# Patient Record
Sex: Female | Born: 1980 | Race: Black or African American | Hispanic: No | Marital: Single | Smoking: Never smoker
Health system: Southern US, Community
[De-identification: ages and names within clinical notes are randomized; demographics above are authoritative.]

## PROBLEM LIST (undated history)

## (undated) ENCOUNTER — Inpatient Hospital Stay (HOSPITAL_COMMUNITY): Payer: Self-pay

## (undated) DIAGNOSIS — G9600 Cerebrospinal fluid leak, unspecified: Secondary | ICD-10-CM

## (undated) DIAGNOSIS — N200 Calculus of kidney: Secondary | ICD-10-CM

## (undated) DIAGNOSIS — G932 Benign intracranial hypertension: Secondary | ICD-10-CM

## (undated) DIAGNOSIS — Z9889 Other specified postprocedural states: Secondary | ICD-10-CM

## (undated) DIAGNOSIS — R002 Palpitations: Secondary | ICD-10-CM

## (undated) HISTORY — DX: Palpitations: R00.2

## (undated) HISTORY — PX: LITHOTRIPSY: SUR834

## (undated) HISTORY — PX: DILATION AND CURETTAGE OF UTERUS: SHX78

---

## 2001-12-15 ENCOUNTER — Emergency Department (HOSPITAL_COMMUNITY): Admission: EM | Admit: 2001-12-15 | Discharge: 2001-12-15 | Payer: Self-pay | Admitting: Emergency Medicine

## 2002-04-19 ENCOUNTER — Emergency Department (HOSPITAL_COMMUNITY): Admission: EM | Admit: 2002-04-19 | Discharge: 2002-04-19 | Payer: Self-pay | Admitting: Emergency Medicine

## 2002-07-20 ENCOUNTER — Emergency Department (HOSPITAL_COMMUNITY): Admission: EM | Admit: 2002-07-20 | Discharge: 2002-07-20 | Payer: Self-pay | Admitting: Emergency Medicine

## 2003-12-14 ENCOUNTER — Inpatient Hospital Stay (HOSPITAL_COMMUNITY): Admission: AD | Admit: 2003-12-14 | Discharge: 2003-12-14 | Payer: Self-pay | Admitting: Family Medicine

## 2006-09-11 ENCOUNTER — Emergency Department (HOSPITAL_COMMUNITY): Admission: EM | Admit: 2006-09-11 | Discharge: 2006-09-11 | Payer: Self-pay | Admitting: Emergency Medicine

## 2006-10-21 ENCOUNTER — Emergency Department (HOSPITAL_COMMUNITY): Admission: EM | Admit: 2006-10-21 | Discharge: 2006-10-21 | Payer: Self-pay | Admitting: Family Medicine

## 2007-03-06 ENCOUNTER — Encounter: Admission: RE | Admit: 2007-03-06 | Discharge: 2007-03-06 | Payer: Self-pay | Admitting: Otolaryngology

## 2007-06-11 ENCOUNTER — Inpatient Hospital Stay (HOSPITAL_COMMUNITY): Admission: AD | Admit: 2007-06-11 | Discharge: 2007-06-20 | Payer: Self-pay | Admitting: Obstetrics & Gynecology

## 2007-06-18 ENCOUNTER — Encounter: Payer: Self-pay | Admitting: Obstetrics & Gynecology

## 2007-06-19 ENCOUNTER — Encounter (INDEPENDENT_AMBULATORY_CARE_PROVIDER_SITE_OTHER): Payer: Self-pay | Admitting: *Deleted

## 2007-08-03 ENCOUNTER — Ambulatory Visit (HOSPITAL_COMMUNITY): Admission: RE | Admit: 2007-08-03 | Discharge: 2007-08-03 | Payer: Self-pay | Admitting: *Deleted

## 2007-10-11 HISTORY — PX: ABDOMINAL CERCLAGE: SHX5384

## 2007-11-09 ENCOUNTER — Inpatient Hospital Stay (HOSPITAL_COMMUNITY): Admission: AD | Admit: 2007-11-09 | Discharge: 2007-11-09 | Payer: Self-pay | Admitting: Obstetrics & Gynecology

## 2007-11-26 ENCOUNTER — Ambulatory Visit (HOSPITAL_COMMUNITY): Admission: RE | Admit: 2007-11-26 | Discharge: 2007-11-26 | Payer: Self-pay | Admitting: *Deleted

## 2007-12-16 ENCOUNTER — Inpatient Hospital Stay (HOSPITAL_COMMUNITY): Admission: AD | Admit: 2007-12-16 | Discharge: 2007-12-16 | Payer: Self-pay | Admitting: Obstetrics & Gynecology

## 2007-12-16 ENCOUNTER — Inpatient Hospital Stay (HOSPITAL_COMMUNITY): Admission: AD | Admit: 2007-12-16 | Discharge: 2007-12-16 | Payer: Self-pay | Admitting: *Deleted

## 2007-12-18 ENCOUNTER — Encounter (INDEPENDENT_AMBULATORY_CARE_PROVIDER_SITE_OTHER): Payer: Self-pay | Admitting: Obstetrics and Gynecology

## 2007-12-18 ENCOUNTER — Inpatient Hospital Stay (HOSPITAL_COMMUNITY): Admission: AD | Admit: 2007-12-18 | Discharge: 2007-12-18 | Payer: Self-pay | Admitting: Obstetrics and Gynecology

## 2007-12-26 ENCOUNTER — Encounter (INDEPENDENT_AMBULATORY_CARE_PROVIDER_SITE_OTHER): Payer: Self-pay | Admitting: Obstetrics

## 2007-12-26 ENCOUNTER — Ambulatory Visit (HOSPITAL_COMMUNITY): Admission: RE | Admit: 2007-12-26 | Discharge: 2007-12-26 | Payer: Self-pay | Admitting: Obstetrics

## 2008-01-07 ENCOUNTER — Ambulatory Visit (HOSPITAL_COMMUNITY): Admission: RE | Admit: 2008-01-07 | Discharge: 2008-01-07 | Payer: Self-pay | Admitting: *Deleted

## 2008-09-26 IMAGING — US US OB FOLLOW-UP
1 series · 14 of 28 positions shown · non-contrast
Comparison: none

OBSTETRICAL ULTRASOUND:

 This ultrasound exam was performed in the [HOSPITAL] Ultrasound Department.  The OB US report was generated in the AS system, and faxed to the ordering physician.  This report is also available in [REDACTED] PACS.

[Series 1: us ob follow-up · 0.28mm/px · 29 acquisitions, 14 frames shown]
[im 2/29]
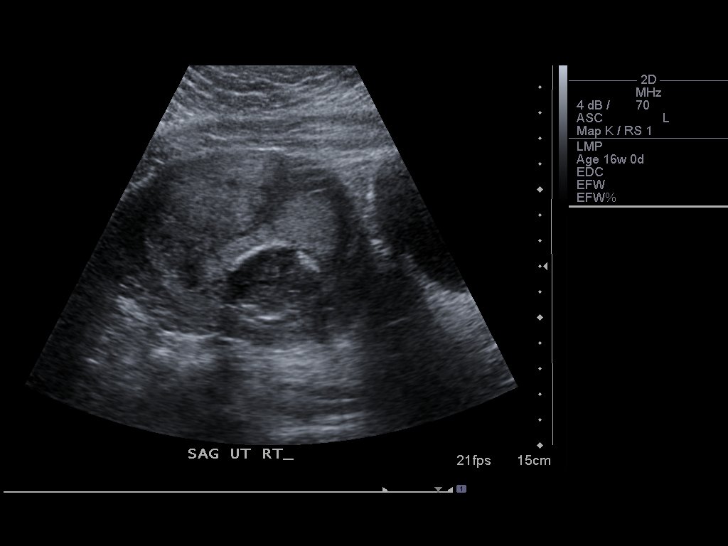
[im 4/29]
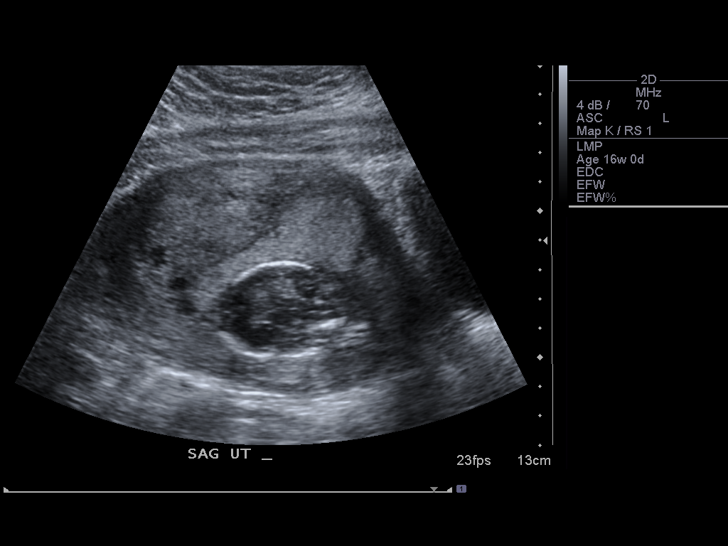
[im 6/29]
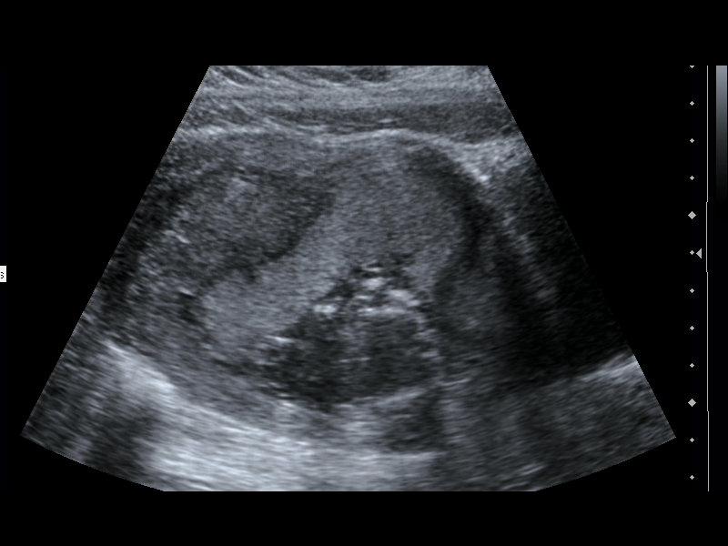
[im 8/29]
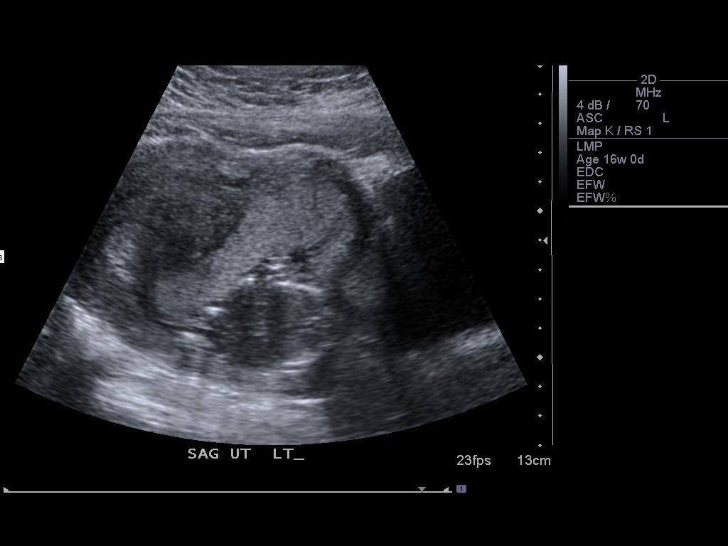
[im 10/29]
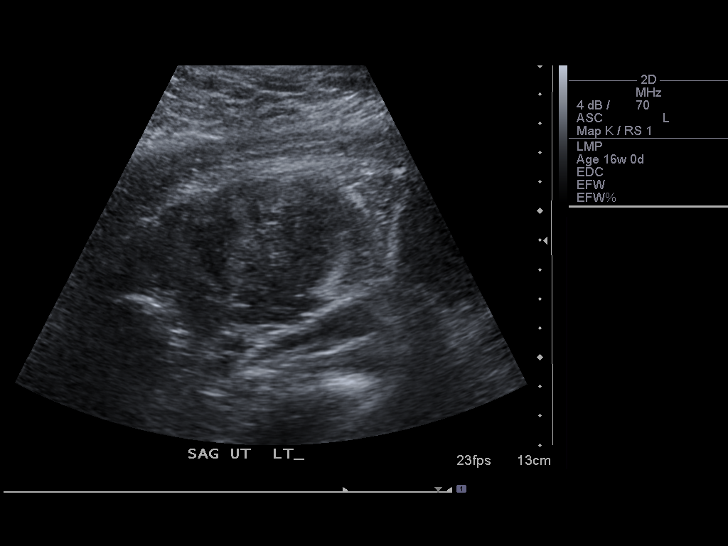
[im 12/29]
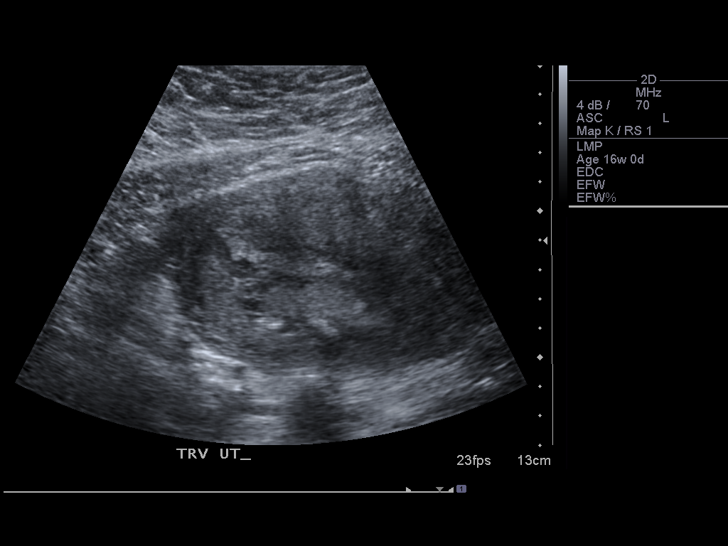
[im 14/29]
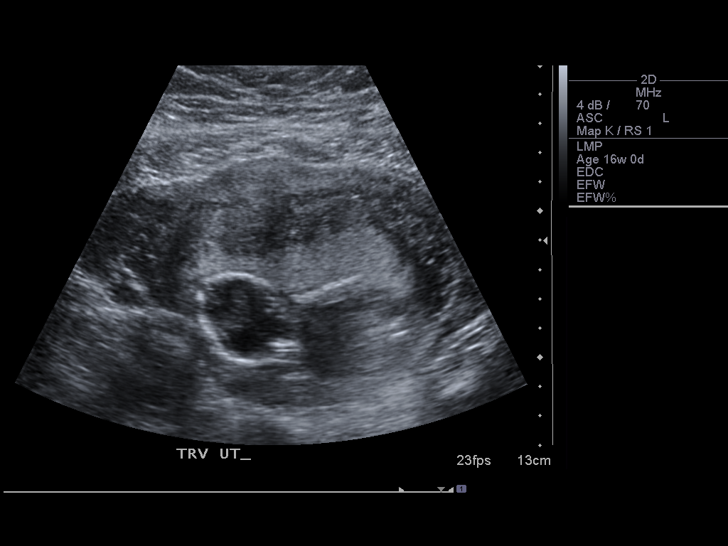
[im 16/29]
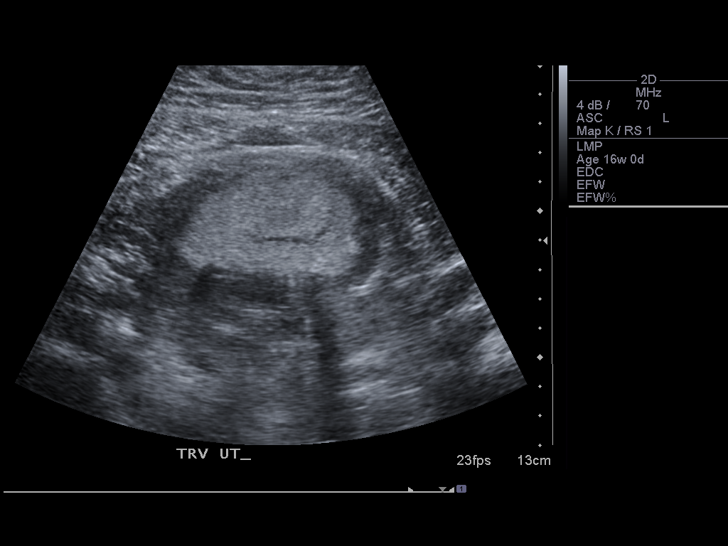
[im 18/29]
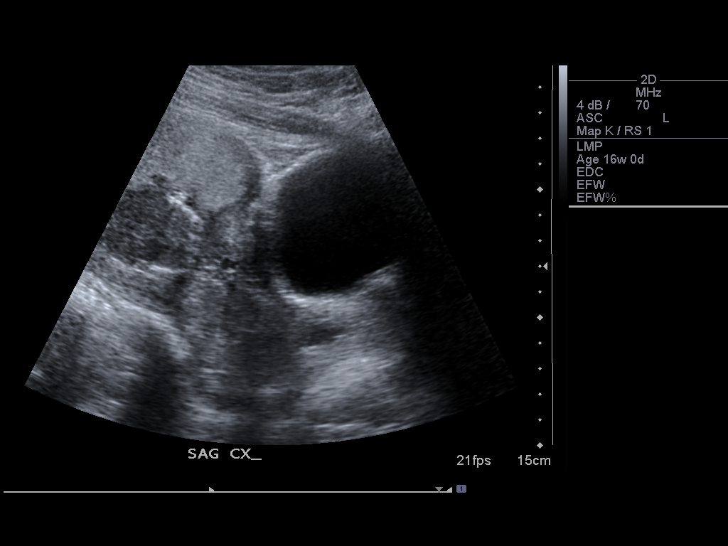
[im 20/29]
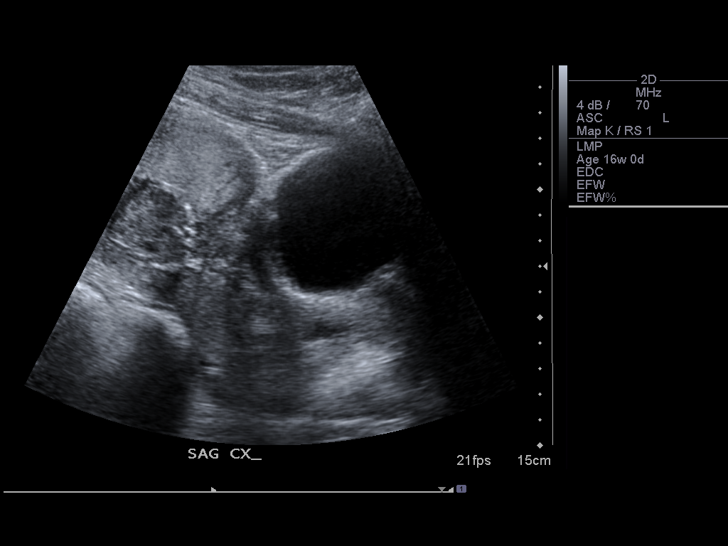
[im 22/29]
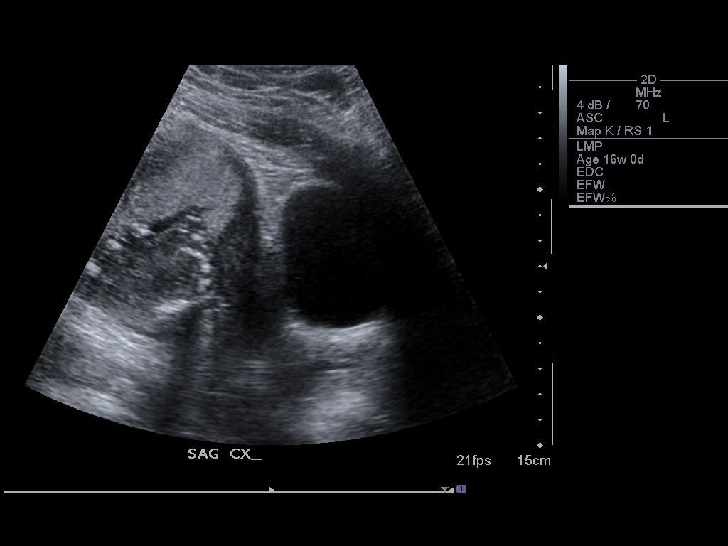
[im 24/29]
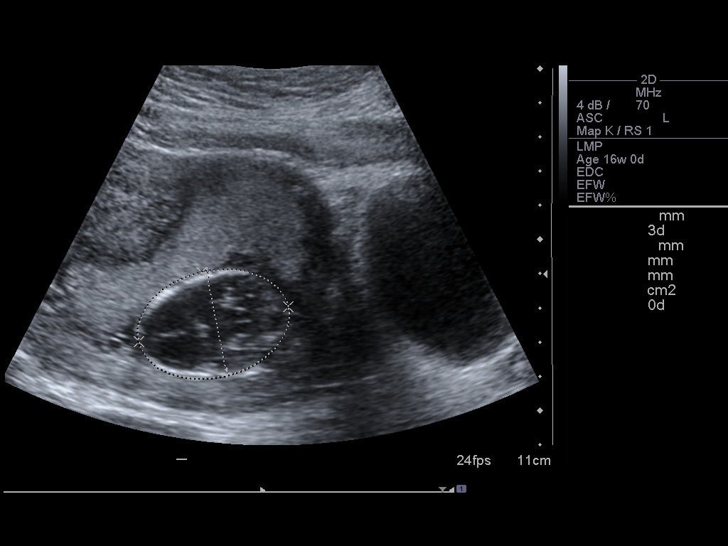
[im 26/29]
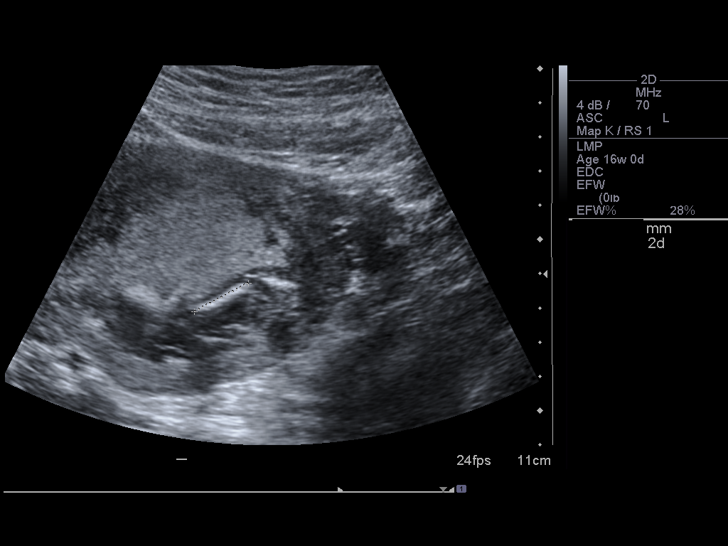
[im 29/29]
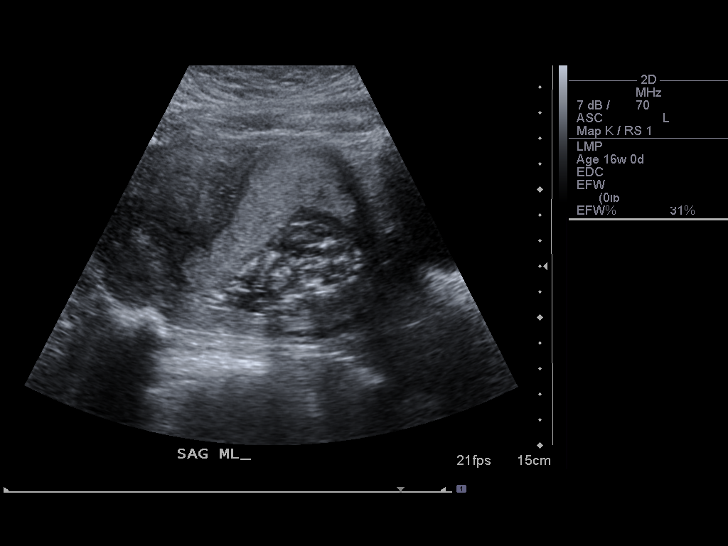

[14 of 28 positions shown; findings below may reference images not displayed]

IMPRESSION: See AS Obstetric US report.

## 2008-12-04 ENCOUNTER — Inpatient Hospital Stay (HOSPITAL_COMMUNITY): Admission: AD | Admit: 2008-12-04 | Discharge: 2008-12-04 | Payer: Self-pay | Admitting: Obstetrics

## 2009-03-03 ENCOUNTER — Inpatient Hospital Stay (HOSPITAL_COMMUNITY): Admission: AD | Admit: 2009-03-03 | Discharge: 2009-03-03 | Payer: Self-pay | Admitting: Obstetrics and Gynecology

## 2009-03-04 ENCOUNTER — Inpatient Hospital Stay (HOSPITAL_COMMUNITY): Admission: AD | Admit: 2009-03-04 | Discharge: 2009-03-04 | Payer: Self-pay | Admitting: Obstetrics and Gynecology

## 2009-03-07 ENCOUNTER — Inpatient Hospital Stay (HOSPITAL_COMMUNITY): Admission: RE | Admit: 2009-03-07 | Discharge: 2009-03-10 | Payer: Self-pay | Admitting: Obstetrics and Gynecology

## 2009-05-09 ENCOUNTER — Emergency Department (HOSPITAL_COMMUNITY): Admission: EM | Admit: 2009-05-09 | Discharge: 2009-05-09 | Payer: Self-pay | Admitting: Emergency Medicine

## 2010-08-22 ENCOUNTER — Emergency Department (HOSPITAL_BASED_OUTPATIENT_CLINIC_OR_DEPARTMENT_OTHER): Admission: EM | Admit: 2010-08-22 | Discharge: 2010-08-22 | Payer: Self-pay | Admitting: Emergency Medicine

## 2010-11-01 ENCOUNTER — Encounter: Payer: Self-pay | Admitting: Obstetrics & Gynecology

## 2010-12-12 ENCOUNTER — Inpatient Hospital Stay (HOSPITAL_COMMUNITY)
Admission: AD | Admit: 2010-12-12 | Discharge: 2010-12-12 | Disposition: A | Discharge status: Home / Self Care | Payer: 59 | Source: Ambulatory Visit | Attending: Obstetrics & Gynecology | Admitting: Obstetrics & Gynecology

## 2010-12-12 DIAGNOSIS — N949 Unspecified condition associated with female genital organs and menstrual cycle: Secondary | ICD-10-CM | POA: Insufficient documentation

## 2010-12-12 DIAGNOSIS — N390 Urinary tract infection, site not specified: Secondary | ICD-10-CM | POA: Insufficient documentation

## 2010-12-12 LAB — WET PREP, GENITAL
Clue Cells Wet Prep HPF POC: NONE SEEN
Trich, Wet Prep: NONE SEEN
Yeast Wet Prep HPF POC: NONE SEEN

## 2010-12-12 LAB — URINALYSIS, ROUTINE W REFLEX MICROSCOPIC
Bilirubin Urine: NEGATIVE
Hgb urine dipstick: NEGATIVE
Ketones, ur: NEGATIVE mg/dL
Nitrite: NEGATIVE
Protein, ur: NEGATIVE mg/dL

## 2010-12-13 LAB — GC/CHLAMYDIA PROBE AMP, GENITAL: GC Probe Amp, Genital: NEGATIVE

## 2010-12-14 LAB — URINE CULTURE
Culture  Setup Time: 201203051417
Special Requests: NEGATIVE

## 2011-01-16 LAB — POCT PREGNANCY, URINE: Preg Test, Ur: NEGATIVE

## 2011-01-16 LAB — POCT URINALYSIS DIP (DEVICE)
Glucose, UA: NEGATIVE mg/dL
Ketones, ur: NEGATIVE mg/dL
Specific Gravity, Urine: 1.02 (ref 1.005–1.030)
pH: 6 (ref 5.0–8.0)

## 2011-01-18 LAB — CBC
HCT: 27.3 % — ABNORMAL LOW (ref 36.0–46.0)
HCT: 31.1 % — ABNORMAL LOW (ref 36.0–46.0)
Hemoglobin: 10.8 g/dL — ABNORMAL LOW (ref 12.0–15.0)
Hemoglobin: 9.4 g/dL — ABNORMAL LOW (ref 12.0–15.0)
MCHC: 34.6 g/dL (ref 30.0–36.0)
MCV: 92.6 fL (ref 78.0–100.0)
Platelets: 226 10*3/uL (ref 150–400)
RDW: 14.5 % (ref 11.5–15.5)

## 2011-01-18 LAB — RPR: RPR Ser Ql: NONREACTIVE

## 2011-01-25 LAB — URINE CULTURE

## 2011-01-25 LAB — URINALYSIS, ROUTINE W REFLEX MICROSCOPIC
Bilirubin Urine: NEGATIVE
Glucose, UA: NEGATIVE mg/dL
Ketones, ur: NEGATIVE mg/dL
Nitrite: NEGATIVE
Protein, ur: NEGATIVE mg/dL

## 2011-02-22 NOTE — Discharge Summary (Signed)
Krystal [name], Krystal [name]                ACCOUNT NO.:  [id number]   MEDICAL RECORD NO.:  [id number]          PATIENT TYPE:  INP   LOCATION:  9118                          [facility]ACILITY:  [facility]   PHYSICIAN:  Lenoard Aden, M.D.DATE OF BIRTH:  [birth year]   DATE OF ADMISSION:  03/07/2009  DATE OF DISCHARGE:  03/10/2009                               DISCHARGE SUMMARY   The patient is a gravida 5, para 0-0-4-0 at 13 weeks' gestation.  The  patient has received prenatal care at [facility] since [redacted] weeks  gestation with Dr. Billy Coast, his primary.   Prenatal labs include the patient is O+, rubella positive, HIV negative,  RPR negative, hepatitis negative.  The patient has no known drug  allergies.   Current medications include a prenatal vitamin daily and hydrocodone 1  tablet p.o. every 4-6 hours as needed for pain.   SIGNIFICANT PREGNANCY HISTORY:  Termination AB x2 and 2 pregnancy losses  1 at 16 weeks and 1 at 21 weeks respectively for preterm premature  rupture of membranes and premature dilation with pregnancy loss.   Pregnancy course is complicated with incompetent cervix.  The patient  has a transabdominal cerclage in place.  The patient received weekly 17  B injection started 15 weeks until [redacted] weeks gestation.  The patient had  an amniocentesis for fetal lung maturity due to cervical change with  abdominal cerclage with positive fetal lung maturity at which time a C-  section was scheduled at [redacted] weeks gestation.  Preop CBC white blood cell  was 9.1, hemoglobin 10.8, hematocrit 31.1, platelet count of 226.  The  patient underwent a cesarean section on May 29 with Dr. Billy Coast, see  operative note for a viable female newborn.  Postoperative course was  uneventful with a postoperative CBC of white blood cell count 12,  hemoglobin 9.4, hematocrit 27.3, and platelet count of 201.  Vital signs  remained within normal limits and the patient remained afebrile during  postpartum course.  On day of  discharge, vital signs were 98.3 pulses  71, respirations 18, and a blood pressure of 104/68.  Wound was healing  appropriately and staples were removed on postoperative day #3 and Steri-  Strips applied.  Wound edges are well approximated.  No evidence of  seroma or infection at time of discharge.  The patient is in stable  condition, ambulatory, discharged home with her newborn.  Postoperative  instructions per [facility] discharge booklet.  Medications at time  of discharge include a prenatal vitamin 1 p.o. daily, ibuprofen 600 mg 1  p.o. every 6-8 hours as needed for discomfort, Percocet 1-2 tablets  every 6 hours as needed for pain.  The patient to follow up at [facility] in 6 weeks.      Marlinda Mike, C.N.M.      Lenoard Aden, M.D.  Electronically Signed    TB/MEDQ  D:  03/10/2009  T:  03/10/2009  Job:  [id number]

## 2011-02-22 NOTE — Discharge Summary (Signed)
NAMEKEIONNA, KINNAIRD                ACCOUNT NO.:  [id number]   MEDICAL RECORD NO.:  [id number]          PATIENT TYPE:  INP   LOCATION:  9314                          [facility]ACILITY[facility]  [facility]   PHYSICIAN:  Gerri Spore B. Earlene Plater, M.D.  DATE OF BIRTH:  June 05, 1981   DATE OF ADMISSION:  06/11/2007  DATE OF DISCHARGE:  06/20/2007                               DISCHARGE SUMMARY   ADMITTING DIAGNOSIS:  1. Nineteen-week intrauterine pregnancy.  2. Cervical incompetence.  3. Preterm premature rupture of membranes.   HISTORY OF PRESENT ILLNESS:  Patient is a 30 year old African-American  female, gravida 3, para 0, A-2, presented at 19-6/7 weeks with leakage  of fluid and cramping.  On physical exam, was found to have bulging  membranes at the cervical os, 3 cm dilated, and showed microscopic  evidence of ruptured membranes, although was not grossly ruptured at  that time.  Amniotic fluid index was greater than 10 cm with viable  fetus.   Patient was advised of the risks of infection and high likelihood of  poor fetal outcome.   HOSPITAL COURSE:  Patient was admitted, maternal fetal medicine  consultation obtained.  She was placed on ampicillin and erythromycin  per protocol.  Patient was again advised of the dismal prognosis;  however, wanted to continue the pregnancy if there was any hope at all.   She remained stable until the day prior to delivery, when she had gross  leakage of fluid.  Ultrasound confirmed no measurable fluid remaining.  She also had slightly increased leukocytosis.  Patient opted at that  point for termination of pregnancy.  This was performed without  incident, using Cytotec vaginally.  She had an uncomplicated vaginal  delivery and spontaneous expulsion of the placenta.   The day after delivery, patient remained afebrile, was grieving  appropriately and medically stable for discharge.  Patient was informed  that, with a subsequent pregnancy, cerclage will be recommended.   DISCHARGE INSTRUCTIONS:  Per booklet.   FOLLOWUP:  One month, [facility].  [facility] referral was made.   DISPOSITION AT DISCHARGE:  Satisfactory.      Gerri Spore B. Earlene Plater, M.D.  Electronically Signed     WBD/MEDQ  D:  06/20/2007  T:  06/20/2007  Job:  [id number]

## 2011-02-22 NOTE — H&P (Signed)
NAMEORINE, Krystal [name]                ACCOUNT NO.:  [id number]   MEDICAL RECORD NO.:  [id number]          PATIENT TYPE:  INP   LOCATION:  9118                          [facility]   PHYSICIAN:  Lenoard Aden, M.D.DATE OF BIRTH:  1981/01/26   DATE OF ADMISSION:  03/07/2009  DATE OF DISCHARGE:                              HISTORY & PHYSICAL   CHIEF COMPLAINT:  This is a 67 and 1/2-week intrauterine pregnancy with  a known abdominal cerclage, placed as an interval procedure for primary  C-section.  The patient has noted increased contractions, has noted on  NST in the past week with cervical change noted.  Decision was made to  proceed with primary C-section due to cervical changes with known  abdominal cerclage with concerns regarding cervical damage.  This case  was discussed with Maternal Fetal Medicine.  In addition, the patient  has had an amniocentesis performed on Mar 02, 2009, which revealed an  LS/PG ratio of 2.1, PG negative.  She received betamethasone on Mar 02, 2009, and Mar 03, 2009.   MEDICATIONS:  Prenatal vitamins.  She had 17 hydroxyprogesterone until  37 weeks.   She has a history of abnormal Pap smear, history of PPROM with D and C  for retained products, history of SAB, history of previously noted  uterine fibroids, history of 21 weeks' fetal demise.  She also has a  history of urolithiasis with 1 episode of flank pain during this  pregnancy.   On physical, she is otherwise a G5, P0 with a history of 2 losses as  noted and also TABs in 1998 and 2002.   PHYSICAL EXAMINATION:  GENERAL:  She is a well-developed, well-nourished  [place] female in no acute distress.  HEENT:  Normal.  LUNGS:  Clear.  HEART:  Regular rhythm.  ABDOMEN:  Soft, gravid, and nontender.  Estimated fetal weight 7-1/2  pounds.  Cervix is closed, 60% vertex, -1.  EXTREMITIES:  No cords.  NEUROLOGIC:  Nonfocal.  SKIN:  Intact.   IMPRESSION:  1. A 37 plus week intrauterine  pregnancy.  2. History of abdominal cerclage.  3. Group B streptococcus positive.  4. History of in vitro amniocentesis, status post betamethasone.  5. History of preterm birth and cervical insufficiency with abdominal      cerclage.  6. History of urolithiasis with transient episode this pregnancy, on      Vicodin briefly, no longer at this time.   PLAN:  Proceed with primary low-segment transverse cesarean section.  Risks of anesthesia, infection, bleeding, injury to abdominal organs,  need for repair discussed, delayed versus immediate complications to  include bowel and bladder injury noted.  The patient acknowledges and  wished to proceed.      Lenoard Aden, M.D.  Electronically Signed     RJT/MEDQ  D:  03/07/2009  T:  03/08/2009  Job:  [id number]

## 2011-02-22 NOTE — Op Note (Signed)
Krystal [name], Krystal [name]                ACCOUNT NO.:  [id number]   MEDICAL RECORD NO.:  [id number]          PATIENT TYPE:  AMB   LOCATION:  SDC                           [facility]   PHYSICIAN:  Lendon Colonel, MD   DATE OF BIRTH:  08/05/1981   DATE OF PROCEDURE:  12/26/2007  DATE OF DISCHARGE:                               OPERATIVE REPORT   PREOPERATIVE DIAGNOSIS:  Retained placenta.   POSTOPERATIVE DIAGNOSIS:  Retained placenta.   PROCEDURE:  Ultrasound-guided suction dilatation and curettage.   SURGEON:  Lendon Colonel, M.D.   ASSISTANT:  None.   ANESTHESIA:  General.   ANTIBIOTICS:  Doxycycline 100 mcg IV.   ESTIMATED BLOOD LOSS:  Minimal.   SPECIMENS:  To pathology.   COMPLICATIONS:  None.   FINDINGS:  A small amount of POC's, six week sized uterus, and thin  endometrial stripe.  Post procedure, no increased vascular flow on  ultrasound.   PROCEDURE:  After informed consent was obtained, the risks, benefits and  alternatives of treatment were discussed with the patient.  The patient  was taken to the operating room where general anesthesia was initiated .  She was prepped and draped in the normal sterile fashion in the dorsal  lithotomy position.  No catheterization was carried out.  A sterile  speculum was placed in the vagina.  A single-tooth tenaculum was used to  grasp the anterior lip of the cervix.  Of note, the cervix appeared 2 cm  in length.  The cervix was dilated to a #25 Pratt dilator under  ultrasound guidance.  Of note, there was sharp anteversion of the  uterus.  The #8 suction curette was then inserted into the mid part of  the uterus and with two passes, a small amount of POCs were obtained.  The suction curette was removed.  The stripe with no increased vascular  flow.  The tenaculum was removed.  Pressure was placed on the  tenaculum site for adequate hemostasis.  The speculum was removed.  The  bimanual examination revealed a firm uterus  post procedure.  The  procedure was terminated.  The sponge, lap, and needle counts were  correct x3, and the patient was taken to the recovery room in stable  condition.      Lendon Colonel, MD  Electronically Signed     KAF/MEDQ  D:  12/26/2007  T:  12/26/2007  Job:  [id number]

## 2011-02-22 NOTE — H&P (Signed)
Krystal [name], Krystal [name]                ACCOUNT NO.:  [id number]   MEDICAL RECORD NO.:  [id number]          PATIENT TYPE:  MAT   LOCATION:  MATC                          [facility]   PHYSICIAN:  Gerri Spore B. Earlene Plater, M.D.  DATE OF BIRTH:  1980-10-14   DATE OF ADMISSION:  12/16/2007  DATE OF DISCHARGE:                              HISTORY & PHYSICAL   CHIEF COMPLAINT:  Change in vaginal discharge.   HISTORY OF PRESENT ILLNESS:  A 30 year old African American female  gravida 2, para 0, A1 at 15 weeks with a history of previous 19-week  preterm rupture of membranes and subsequent preterm birth, who had a  normal evaluation prior to this pregnancy.  Prophylactic cerclage placed  at 13 weeks and has been on progesterone since 14 weeks, noted today a  change in vaginal discharge.  No true leakage of fluid.  No fever, pain,  odor, or bleeding.   PAST MEDICAL HISTORY:  Otherwise negative.   PAST SURGICAL HISTORY:  Cerclage as above.   SOCIAL HISTORY:  Noncontributory.   MEDICATIONS:  Prenatal vitamins and 17-hydroxyprogesterone weekly  injections.   ALLERGIES:  None.   REVIEW OF SYSTEMS:  Otherwise negative.   PHYSICAL EXAMINATION:  VITAL SIGNS:  Temperature 98.3, blood pressure  112/70, pulse 92, respirations 20.  GENERAL:  The patient is alert and oriented in no acute distress.  SKIN:  Warm and dry with no lesions.  ABDOMEN:  Nontender.  PELVIC:  Sterile speculum exam shows no pooling, vaginal secretions are  Nitrazine negative, and slide is obtained which is negative for ferning.  Cervix is closed.  Cerclage is intact.   Ultrasound shows a viable fetus with normal amniotic fluid volume.  Of  note the cervix is dilated above the cerclage.  There is no measurable  cervical length.   ASSESSMENT:  1. Change in vaginal discharge with poor obstetric history.  No      evidence to confirm rupture of membranes at this time.  The patient      was advised to go home and rest.  Call with  any change in vaginal      symptoms and to follow up in the office tomorrow for a repeat      amniotic fluid index.  2. Regarding the cervical findings, the patient is instructed that      cerclage appears to be holding the cervix closed,      would not recommend rescue cerclage as it would probably increase      the risk of rupture of membranes during the placement of the second      stitch given that her cervix is dilated above the cerclage.      Continue progesterone weekly and bed rest for now.  Further      disposition based on followup ultrasound in the office.      Gerri Spore B. Earlene Plater, M.D.  Electronically Signed     WBD/MEDQ  D:  12/16/2007  T:  12/16/2007  Job:  [id number]

## 2011-02-22 NOTE — Op Note (Signed)
[name], [name]                ACCOUNT NO.:  [id number]   MEDICAL RECORD NO.:  [id number]          PATIENT TYPE:  INP   LOCATION:  9118                          [facility]   PHYSICIAN:  Lenoard Aden, M.D.DATE OF BIRTH:  03-03-1981   DATE OF PROCEDURE:  03/07/2009  DATE OF DISCHARGE:                               OPERATIVE REPORT   PREOPERATIVE DIAGNOSIS:  A 37-week intrauterine pregnancy with abdominal  cerclage, cervical change and increasing uterine  contraction.   POSTOPERATIVE DIAGNOSIS:  A 37-week intrauterine pregnancy with  abdominal cerclage, cervical change and increasing uterine contraction.   PROCEDURE:  Primary low segment transverse cesarean section.   SURGEON:  Lenoard Aden, MD   ASSISTANT:  Dineen Kid. Rana Snare, M.D.   ANESTHESIA:  Spinal by Oddono.   ESTIMATED BLOOD LOSS:  500 mL.   COMPLICATIONS:  None.   DRAINS:  Foley.   COUNTS:  Correct.   The patient to recovery in good condition.   FINDINGS:  Full-term living female, occiput transverse position.  Apgars 8  and 9.  Normal tubes.  Normal ovaries.  Small subserosal fundal  fibroids. Anterior placenta.  Standard two-layer uterine closure.  Intact  abdominal cerclage.   BRIEF OPERATIVE NOTE:  After being apprised of the risks of anesthesia,  infection, bleeding, injury to abdominal organs, need for repair,  delayed versus immediate complications to include bowel and bladder  injury, the patient was brought to the operating room where she was  administered spinal anesthetic without complications, prepped and draped  in usual sterile fashion.  Foley catheter placed.  After achieving  adequate anesthesia, dilute Marcaine solution placed.  Pfannenstiel skin  incision made with scalpel, carried down to fascia which nicked in  midline and opened transversely using Mayo scissors.  Rectus muscles  were dissected sharply in the midline.  Peritoneum was entered sharply.  Bladder blade was placed.   Visceral peritoneum was scored sharply off  the lower uterine segment.  Kerr hysterotomy incision was made.  Atraumatic delivery of full-term living female.  Amniotomy with clear  fluid.  Apgars 8 and 9 is noted.  Cord blood was collected.  Placenta  was delivered manually, intact 3-vessel cord.  Uterus was exteriorized,  curetted using a dry lap pack and closed in 2 running imbricating layers  of 0 Monocryl suture.  Good hemostasis was noted.  Bladder flap was  inspected and found to be hemostatic.  Irrigation  accomplished.  Fascia was then closed using a 0 Monocryl in continuous  running fashion.  Subcutaneous tissue reapproximated using a 2-0 plain  in running fashion.  The skin was closed using staples.  The patient  tolerated the procedure well and was transferred to recovery room in  stable condition.      Lenoard Aden, M.D.  Electronically Signed     RJT/MEDQ  D:  03/07/2009  T:  03/08/2009  Job:  [id number]

## 2011-02-22 NOTE — Op Note (Signed)
Krystal [name], Krystal [name]                ACCOUNT NO.:  [id number]   MEDICAL RECORD NO.:  [id number]          PATIENT TYPE:  AMB   LOCATION:  SDC                           [facility]   PHYSICIAN:  Shelby B. Earlene Plater, M.D.  DATE OF BIRTH:  01-25-1981   DATE OF PROCEDURE:  11/26/2007  DATE OF DISCHARGE:                               OPERATIVE REPORT   PREOPERATIVE DIAGNOSIS:  Previous 19-week preterm premature rupture of  membranes and preterm loss presumably due to cervical incompetence.   POSTOPERATIVE DIAGNOSIS:  Previous 19-week preterm premature rupture of  membranes and preterm loss presumably due to cervical incompetence.   PROCEDURE:  Cervical cerclage.   SURGEON:  Marina Gravel, M.D.   ASSISTANT:  None.   ANESTHESIA:  Spinal.   FINDINGS:  None.   SPECIMENS:  None.   BLOOD LOSS:  Minimal.   COMPLICATIONS:  None.   INDICATIONS:  Patient with above history for cervical cerclage.  History  reviewed.  No other alternative explanations other than cervical  incompetence were determined.  The patient advised that having had only  one previous preterm loss, it was not unreasonable to follow the cervix  with cervical length and place cerclage if there is shortening; however,  the patient preferred to go ahead with a prophylactic cerclage.  Advised  the risks of surgery including infection, bleeding, potential loss of  pregnancy.  Fetal heart tones were confirmed and the preop area prior to  coming back to the OR.   PROCEDURE:  The patient is taken to the operating room.  Spinal  anesthesia obtained.  She is prepped and draped in standard fashion.  Foley catheter used to drain the bladder.  Exam shows a normal appearing  cervix with no  dilation.  A single pursestring suture of 0-0 Prolene placed.  Air knot  placed after the suture was tied down for later identification.  Cervix  was hemostatic.   The patient tolerated the with no complications.  She was taken recovery  room  awake, alert, in stable condition.      Gerri Spore B. Earlene Plater, M.D.  Electronically Signed     WBD/MEDQ  D:  11/26/2007  T:  11/27/2007  Job:  [id number]

## 2011-06-20 ENCOUNTER — Emergency Department (HOSPITAL_BASED_OUTPATIENT_CLINIC_OR_DEPARTMENT_OTHER)
Admission: EM | Admit: 2011-06-20 | Discharge: 2011-06-20 | Disposition: A | Discharge status: Home / Self Care | Payer: 59 | Attending: Emergency Medicine | Admitting: Emergency Medicine

## 2011-06-20 DIAGNOSIS — L738 Other specified follicular disorders: Secondary | ICD-10-CM

## 2011-06-20 DIAGNOSIS — K0889 Other specified disorders of teeth and supporting structures: Secondary | ICD-10-CM

## 2011-06-20 DIAGNOSIS — K089 Disorder of teeth and supporting structures, unspecified: Secondary | ICD-10-CM | POA: Insufficient documentation

## 2011-06-20 DIAGNOSIS — K029 Dental caries, unspecified: Secondary | ICD-10-CM | POA: Insufficient documentation

## 2011-06-20 DIAGNOSIS — R21 Rash and other nonspecific skin eruption: Secondary | ICD-10-CM | POA: Insufficient documentation

## 2011-06-20 HISTORY — DX: Calculus of kidney: N20.0

## 2011-06-20 MED ORDER — CEPHALEXIN 500 MG PO CAPS: 500.0000 mg | ORAL_CAPSULE | Freq: Four times a day (QID) | ORAL | Status: AC

## 2011-06-20 MED ORDER — HYDROCODONE-ACETAMINOPHEN 5-325 MG PO TABS: 2.0000 | ORAL_TABLET | ORAL | Status: AC | PRN

## 2011-06-20 NOTE — ED Provider Notes (Signed)
History     CSN: [id number] Arrival date & time: 06/20/2011  6:50 PM  Chief Complaint  Patient presents with  . Dental Pain  . Rash   Patient is a 30 y.o. female presenting with tooth pain and rash. The history is provided by the patient.  Dental PainThe primary symptoms include mouth pain and dental injury. The symptoms began 2 days ago. The symptoms are worsening. The symptoms are new. The symptoms occur constantly.  Mouth pain began more than 1 week ago. Mouth pain occurs constantly. Affected locations include: teeth. At its highest the mouth pain was at 6/10. The mouth pain is currently at 6/10.  Additional symptoms include: gum swelling and gum tenderness. Medical issues do not include: smoking.   Rash  This is a new problem. The current episode started more than 1 week ago. The problem has been gradually worsening. The problem is associated with nothing. There has been no fever. The rash is present on the torso. The pain is at a severity of 7/10. The pain is moderate. The pain has been constant since onset. She has tried nothing for the symptoms.  Pt has a rash on chest after shaving hair on chest  Past Medical History  Diagnosis Date  . Kidney stone     Past Surgical History  Procedure Date  . Lithotripsy     No family history on file.  History  Substance Use Topics  . Smoking status: Never Smoker   . Smokeless tobacco: Not on file  . Alcohol Use: No    OB History    Grav Para Term Preterm Abortions TAB SAB Ect Mult Living                  Review of Systems  HENT: Positive for dental problem.   Skin: Positive for rash.  All other systems reviewed and are negative.    Physical Exam  BP 117/74  Pulse 77  Temp(Src) 98.4 F (36.9 C) (Oral)  Resp 16  Ht 5\' 5"  (1.651 m)  Wt 225 lb (102.059 kg)  BMI 37.44 kg/m2  SpO2 100%  LMP 05/11/2011  Physical Exam  Nursing note and vitals reviewed. Constitutional: She is oriented to person, place, and time. She  appears well-developed and well-nourished.  HENT:  Head: Normocephalic and atraumatic.  Nose: Nose normal.  Mouth/Throat: Oropharynx is clear and moist. Dental caries present.    Eyes: Conjunctivae and EOM are normal. Pupils are equal, round, and reactive to light.  Neck: Normal range of motion.  Cardiovascular: Normal rate.   Abdominal: Soft.  Musculoskeletal: She exhibits edema.  Neurological: She is alert and oriented to person, place, and time.  Skin: Rash noted.  Psychiatric: She has a normal mood and affect.  multiple pimples chest,  Ingrown hair follicles  ED Course  Procedures  MDM I will treat with keflex (skin coverage and dental)        Langston Masker, PA 06/20/11 1955  Langston Masker, [place] 06/20/11 1958

## 2011-06-20 NOTE — ED Notes (Signed)
C/o toothche "fora long time"-rash started "over the weekend"

## 2011-06-22 NOTE — ED Provider Notes (Signed)
History/physical exam/procedure(s) were performed by non-physician practitioner and as supervising physician I was immediately available for consultation/collaboration. I have reviewed all notes and am in agreement with care and plan.  Hilario Quarry, MD 06/22/11 [phone]

## 2011-06-30 LAB — URINALYSIS, ROUTINE W REFLEX MICROSCOPIC
Bilirubin Urine: NEGATIVE
Glucose, UA: NEGATIVE
Specific Gravity, Urine: 1.025
pH: 5.5

## 2011-06-30 LAB — URINE MICROSCOPIC-ADD ON

## 2011-07-01 LAB — DIFFERENTIAL
Basophils Relative: 0
Eosinophils Absolute: 0.1
Eosinophils Relative: 1
Monocytes Relative: 9
Neutrophils Relative %: 68

## 2011-07-01 LAB — CBC
MCV: 89.3
Platelets: 273
RBC: 4.21
WBC: 8.9

## 2011-07-04 LAB — WET PREP, GENITAL
Trich, Wet Prep: NONE SEEN
Yeast Wet Prep HPF POC: NONE SEEN

## 2011-07-04 LAB — WOUND CULTURE
Gram Stain: NONE SEEN
Special Requests: 16

## 2011-07-04 LAB — ANAEROBIC CULTURE: Gram Stain: NONE SEEN

## 2011-07-04 LAB — CBC
HCT: 33.2 — ABNORMAL LOW
Hemoglobin: 12.3
Hemoglobin: 11.4 — ABNORMAL LOW
MCHC: 34
RBC: 3.71 — ABNORMAL LOW
RDW: 13.9
RDW: 14
WBC: 9.1

## 2011-07-04 LAB — SAMPLE TO BLOOD BANK

## 2011-07-04 LAB — STREP B DNA PROBE

## 2011-07-22 LAB — URINALYSIS, ROUTINE W REFLEX MICROSCOPIC
Bilirubin Urine: NEGATIVE
Glucose, UA: 100 — AB
Hgb urine dipstick: NEGATIVE
Specific Gravity, Urine: 1.025
Urobilinogen, UA: 0.2
pH: 6.5

## 2011-07-22 LAB — CBC
Hemoglobin: 10.6 — ABNORMAL LOW
Hemoglobin: 12.2
MCHC: 33.7
MCHC: 34.1
MCHC: 35.2
MCV: 89.2
MCV: 90.5
Platelets: 292
RBC: 3.36 — ABNORMAL LOW
RBC: 3.53 — ABNORMAL LOW
RBC: 4.01
RDW: 13.6
RDW: 13.7
WBC: 10.7 — ABNORMAL HIGH
WBC: 15.7 — ABNORMAL HIGH

## 2011-07-22 LAB — DIFFERENTIAL
Basophils Absolute: 0.1
Basophils Relative: 0
Basophils Relative: 1
Eosinophils Absolute: 0.1
Eosinophils Absolute: 0.2
Lymphs Abs: 2
Monocytes Absolute: 0.9 — ABNORMAL HIGH
Monocytes Absolute: 1 — ABNORMAL HIGH
Monocytes Relative: 8
Monocytes Relative: 8
Neutro Abs: 8.8 — ABNORMAL HIGH
Neutrophils Relative %: 72
Neutrophils Relative %: 74

## 2011-07-22 LAB — RPR: RPR Ser Ql: NONREACTIVE

## 2012-05-31 ENCOUNTER — Encounter (HOSPITAL_COMMUNITY): Payer: Self-pay | Admitting: *Deleted

## 2012-05-31 ENCOUNTER — Inpatient Hospital Stay (HOSPITAL_COMMUNITY)
Admission: AD | Admit: 2012-05-31 | Discharge: 2012-05-31 | Disposition: A | Discharge status: Home / Self Care | Payer: BC Managed Care – PPO | Source: Ambulatory Visit | Attending: Obstetrics & Gynecology | Admitting: Obstetrics & Gynecology

## 2012-05-31 DIAGNOSIS — T148XXA Other injury of unspecified body region, initial encounter: Secondary | ICD-10-CM

## 2012-05-31 DIAGNOSIS — M25559 Pain in unspecified hip: Secondary | ICD-10-CM | POA: Insufficient documentation

## 2012-05-31 HISTORY — DX: Other specified postprocedural states: Z98.890

## 2012-05-31 LAB — URINALYSIS, ROUTINE W REFLEX MICROSCOPIC
Glucose, UA: NEGATIVE mg/dL
Leukocytes, UA: NEGATIVE
Nitrite: NEGATIVE
Specific Gravity, Urine: 1.025 (ref 1.005–1.030)
pH: 6 (ref 5.0–8.0)

## 2012-05-31 LAB — POCT PREGNANCY, URINE: Preg Test, Ur: NEGATIVE

## 2012-05-31 MED ORDER — IBUPROFEN 800 MG PO TABS: 800.0000 mg | ORAL_TABLET | Freq: Three times a day (TID) | ORAL | Status: AC | PRN

## 2012-05-31 MED ORDER — CYCLOBENZAPRINE HCL 10 MG PO TABS: 10.0000 mg | ORAL_TABLET | Freq: Three times a day (TID) | ORAL | Status: AC | PRN

## 2012-05-31 MED ORDER — KETOROLAC TROMETHAMINE 60 MG/2ML IM SOLN
60.0000 mg | Freq: Once | INTRAMUSCULAR | Status: AC
  Administered 2012-05-31: 60 mg via INTRAMUSCULAR
  Filled 2012-05-31: qty 2

## 2012-05-31 NOTE — MAU Provider Note (Signed)
History     CSN: [id number]  Arrival date and time: 05/31/12 1305   None     Chief Complaint  Patient presents with  . Hip Pain   HPI 31 y.o. [id number] with left hip pain. Started yesterday while leaning on that hip washing dishes. Pain radiates around from front to back, some radiation down leg. No loss of function of left leg.    Past Medical History  Diagnosis Date  . Kidney stone   . History of lithotripsy     Past Surgical History  Procedure Date  . Lithotripsy     History reviewed. No pertinent family history.  History  Substance Use Topics  . Smoking status: Never Smoker   . Smokeless tobacco: Not on file  . Alcohol Use: No    Allergies:  Allergies  Allergen Reactions  . Ciprofloxacin Nausea And Vomiting    No prescriptions prior to admission    Review of Systems  Constitutional: Negative.   Respiratory: Negative.   Cardiovascular: Negative.   Gastrointestinal: Negative for nausea, vomiting, abdominal pain, diarrhea and constipation.  Genitourinary: Negative for dysuria, urgency, frequency, hematuria and flank pain.       Negative for vaginal bleeding, vaginal discharge, dyspareunia  Musculoskeletal: Positive for joint pain.  Neurological: Negative.   Psychiatric/Behavioral: Negative.    Physical Exam   Blood pressure 118/76, pulse 107, temperature 98.9 F (37.2 C), temperature source Oral, resp. rate 20, height 5' 4.25" (1.632 m), weight 225 lb (102.059 kg), last menstrual period 05/09/2012.  Physical Exam  Nursing note and vitals reviewed. Constitutional: She is oriented to person, place, and time. She appears well-developed and well-nourished. No distress.  Cardiovascular: Normal rate.   Respiratory: Effort normal.  GI: Soft. She exhibits no distension and no mass. There is no tenderness. There is no rebound and no guarding.  Musculoskeletal: Normal range of motion. She exhibits tenderness.       Left hip: She exhibits normal range of  motion and normal strength. Tenderness: tenderness in left hip around to gluteus.  Neurological: She is alert and oriented to person, place, and time.  Skin: Skin is warm and dry.  Psychiatric: She has a normal mood and affect.    MAU Course  Procedures Results for orders placed during the hospital encounter of 05/31/12 (from the past 24 hour(s))  URINALYSIS, ROUTINE W REFLEX MICROSCOPIC     Status: Normal   Collection Time   05/31/12  1:25 PM      Component Value Range   Color, Urine YELLOW  YELLOW   APPearance CLEAR  CLEAR   Specific Gravity, Urine 1.025  1.005 - 1.030   pH 6.0  5.0 - 8.0   Glucose, UA NEGATIVE  NEGATIVE mg/dL   Hgb urine dipstick NEGATIVE  NEGATIVE   Bilirubin Urine NEGATIVE  NEGATIVE   Ketones, ur NEGATIVE  NEGATIVE mg/dL   Protein, ur NEGATIVE  NEGATIVE mg/dL   Urobilinogen, UA 1.0  0.0 - 1.0 mg/dL   Nitrite NEGATIVE  NEGATIVE   Leukocytes, UA NEGATIVE  NEGATIVE  POCT PREGNANCY, URINE     Status: Normal   Collection Time   05/31/12  2:12 PM      Component Value Range   Preg Test, Ur NEGATIVE  NEGATIVE   Some improvement with Toradaol 60 mg IM  Assessment and Plan  31 y.o. [id number] with apparent musculoskeletal hip pain Rx Motrin and Flexeril Recommend follow up with PCP or urgent care if symptoms are not improving  Krystal [name] 05/31/2012, 2:08 PM

## 2012-05-31 NOTE — MAU Note (Signed)
Hx of lithotripsy 2009; occasional back back pain.

## 2012-05-31 NOTE — MAU Note (Signed)
Gait steady when walking.   

## 2012-05-31 NOTE — Progress Notes (Signed)
Dr Juliene Pina called, updated on pt's complaints and status, states she will come and evaluate pt.

## 2012-05-31 NOTE — MAU Note (Signed)
Was doing dishes yesterday, pressed up against lower abd, was painful, pain radiated around to back.   Had intermittent pain shooting into left leg.

## 2013-11-01 ENCOUNTER — Inpatient Hospital Stay (HOSPITAL_COMMUNITY)
Admission: AD | Admit: 2013-11-01 | Discharge: 2013-11-02 | Disposition: A | Discharge status: Home / Self Care | Payer: No Typology Code available for payment source | Source: Ambulatory Visit | Attending: Obstetrics and Gynecology | Admitting: Obstetrics and Gynecology

## 2013-11-01 DIAGNOSIS — N939 Abnormal uterine and vaginal bleeding, unspecified: Secondary | ICD-10-CM

## 2013-11-01 DIAGNOSIS — O209 Hemorrhage in early pregnancy, unspecified: Secondary | ICD-10-CM | POA: Insufficient documentation

## 2013-11-01 DIAGNOSIS — R109 Unspecified abdominal pain: Secondary | ICD-10-CM | POA: Insufficient documentation

## 2013-11-01 DIAGNOSIS — O3680X0 Pregnancy with inconclusive fetal viability, not applicable or unspecified: Secondary | ICD-10-CM

## 2013-11-01 DIAGNOSIS — O3430 Maternal care for cervical incompetence, unspecified trimester: Secondary | ICD-10-CM | POA: Insufficient documentation

## 2013-11-01 HISTORY — DX: Other specified postprocedural states: Z98.890

## 2013-11-01 LAB — URINALYSIS, ROUTINE W REFLEX MICROSCOPIC
BILIRUBIN URINE: NEGATIVE
GLUCOSE, UA: NEGATIVE mg/dL
KETONES UR: 15 mg/dL — AB
LEUKOCYTES UA: NEGATIVE
Nitrite: NEGATIVE
PH: 6 (ref 5.0–8.0)
Protein, ur: 30 mg/dL — AB
Urobilinogen, UA: 1 mg/dL (ref 0.0–1.0)

## 2013-11-01 LAB — URINE MICROSCOPIC-ADD ON: WBC, UA: NONE SEEN WBC/hpf (ref <3)

## 2013-11-01 NOTE — MAU Note (Signed)
Have abdominal cerclage in since 2009. LMP 12/17. Took pregnancy test at home recently and was positive. Bleeding started out brown but tonight was red with some clots. Some abd cramping

## 2013-11-02 ENCOUNTER — Encounter (HOSPITAL_COMMUNITY): Payer: Self-pay

## 2013-11-02 DIAGNOSIS — O200 Threatened abortion: Secondary | ICD-10-CM

## 2013-11-02 DIAGNOSIS — O26859 Spotting complicating pregnancy, unspecified trimester: Secondary | ICD-10-CM

## 2013-11-02 LAB — CBC
HCT: 35.6 % — ABNORMAL LOW (ref 36.0–46.0)
HEMOGLOBIN: 11.7 g/dL — AB (ref 12.0–15.0)
MCH: 30.1 pg (ref 26.0–34.0)
MCHC: 32.9 g/dL (ref 30.0–36.0)
MCV: 91.5 fL (ref 78.0–100.0)
Platelets: 242 10*3/uL (ref 150–400)
RBC: 3.89 MIL/uL (ref 3.87–5.11)
RDW: 13.8 % (ref 11.5–15.5)
WBC: 9.3 10*3/uL (ref 4.0–10.5)

## 2013-11-02 LAB — POCT PREGNANCY, URINE: Preg Test, Ur: POSITIVE — AB

## 2013-11-02 LAB — ABO/RH: ABO/RH(D): O POS

## 2013-11-02 LAB — HCG, QUANTITATIVE, PREGNANCY: HCG, BETA CHAIN, QUANT, S: 125 m[IU]/mL — AB (ref <5)

## 2013-11-02 MED ORDER — OXYCODONE-ACETAMINOPHEN 5-325 MG PO TABS
2.0000 | ORAL_TABLET | Freq: Once | ORAL | Status: AC
  Administered 2013-11-02: 2 via ORAL
  Filled 2013-11-02: qty 2

## 2013-11-02 MED ORDER — OXYCODONE-ACETAMINOPHEN 5-325 MG PO TABS: 1.0000 | ORAL_TABLET | Freq: Four times a day (QID) | ORAL | Status: DC | PRN

## 2013-11-02 NOTE — MAU Provider Note (Signed)
History     CSN: [id number]  Arrival date and time: 11/01/13 2141   None     Chief Complaint  Patient presents with  . Vaginal Bleeding   HPI  Krystal [name] is a 33 yo [phone] with a hx of an incompitant cervix s/p abdominal cerclage who presents for vaginal bleeding and pain. Abdominal cerclage placed in 2009.     LMP 12/17  Took two HPT and were positive on 1/19. Hasn't established care yet.  This evening, she started noticing she was bleeding.  Having some abdominal cramping.  Passing clots. Seems to be getting heavier and more crampy in pain.   No fevers, nausea, vomiting, diarrhea, constipation.  No cp, SOB.    OB History   Grav Para Term Preterm Abortions TAB SAB Ect Mult Living   4 2 1 1 2 1 1   0 1    G1- 24 week delivery  G2- failed mcDonald cerclage at 17 weeks G3- 37 weeks, c/s   Past Medical History  Diagnosis Date  . Kidney stone   . History of lithotripsy   . H/O dilation and curettage     Past Surgical History  Procedure Laterality Date  . Lithotripsy    . Abdominal cerclage  2009  . Cesarean section    . Dilation and curettage of uterus      Family History  Problem Relation Age of Onset  . Asthma Mother     History  Substance Use Topics  . Smoking status: Never Smoker   . Smokeless tobacco: Never Used  . Alcohol Use: No    Allergies:  Allergies  Allergen Reactions  . Ciprofloxacin Nausea And Vomiting    Prescriptions prior to admission  Medication Sig Dispense Refill  . levonorgestrel (PLAN B 1-STEP) 1.5 MG tablet Take 1.5 mg by mouth once.      note- had taken 2 months ago.  Not recently.   Review of Systems  All other systems reviewed and are negative.   Physical Exam   Pulse 81, temperature 98.4 F (36.9 C), resp. rate 18, height 5\' 5"  (1.651 m), weight 85.367 kg (188 lb 3.2 oz).  Physical Exam  Constitutional: She appears well-developed and well-nourished.  HENT:  Head: Normocephalic.  Eyes: Conjunctivae are normal.   Neck: Neck supple.  Cardiovascular: Normal rate.   Respiratory: Effort normal.  GI: Soft. Bowel sounds are normal. She exhibits no distension and no mass. There is tenderness (mid uterine ). There is no rebound and no guarding.  Genitourinary:  Normal external female genitalia Significant amount of bleeding in the vaginal vault with a few small clots.  Cervix is closed.  No adnexal tenderness No uterine tenderness.   Musculoskeletal: Normal range of motion.  Skin: Skin is warm and dry.    MAU Course  Procedures  MDM  Concerning for threatened vs. Inevitable abortion vs. Ectopic pregnancy.   CBC normal  O+ blood type HCG of 125.   Assessment and Plan   - exam relatively benign with only midline tenderness. significant bleeding in the vaginal vault. HCG of 125.  Suspect most likely miscarriage.  - given HCG level, [place] would not be helpful in delineating location of the pregnancy. Discussed this with pt.   As pain is midline and not favoring one of the adnexa and given the level of her quant, after a discussion with Dr. Glo Herring, it was felt that she could return in 2 days for a repeat quant and further management or  imaging based on the results.  Currently passing clots despite that abdominal cerclage so no further intervention at this point needed.   - strict return precautions given - return in 2 days for repeat quant and possible [place] if needed.  - pt agreed.   Salem Lembke L 11/02/2013, 1:32 AM

## 2013-11-02 NOTE — Discharge Instructions (Signed)
Threatened Miscarriage  Bleeding during the first 20 weeks of pregnancy is common. This is sometimes called a threatened miscarriage. This is a pregnancy that is threatening to end before the twentieth week of pregnancy. Often this bleeding stops with bed rest or decreased activities as suggested by your caregiver and the pregnancy continues without any more problems. You may be asked to not have sexual intercourse, have orgasms or use tampons until further notice. Sometimes a threatened miscarriage can progress to a complete or incomplete miscarriage. This may or may not require further treatment. Some miscarriages occur before a woman misses a menstrual period and knows she is pregnant.  Miscarriages occur in 15 to 20% of all pregnancies and usually occur during the first 13 weeks of the pregnancy. The exact cause of a miscarriage is usually never known. A miscarriage is natures way of ending a pregnancy that is abnormal or would not make it to term. There are some things that may put you at risk to have a miscarriage, such as:  · Hormone problems.  · Infection of the uterus or cervix.  · Chronic illness, diabetes for example, especially if it is not controlled.  · Abnormal shaped uterus.  · Fibroids in the uterus.  · Incompetent cervix (the cervix is too weak to hold the baby).  · Smoking.  · Drinking too much alcohol. It's best not to drink any alcohol when you are pregnant.  · Taking illegal drugs.  TREATMENT   When a miscarriage becomes complete and all products of conception (all the tissue in the uterus) have been passed, often no treatment is needed. If you think you passed tissue, save it in a container and take it to your doctor for evaluation. If the miscarriage is incomplete (parts of the fetus or placenta remain in the uterus), further treatment may be needed. The most common reason for further treatment is continued bleeding (hemorrhage) because pregnancy tissue did not pass out of the uterus. This  often occurs if a miscarriage is incomplete. Tissue left behind may also become infected. Treatment usually is dilatation and curettage (the removal of the remaining products of pregnancy. This can be done by a simple sucking procedure (suction curettage) or a simple scraping of the inside of the uterus. This may be done in the hospital or in the caregiver's office. This is only done when your caregiver knows that there is no chance for the pregnancy to proceed to term. This is determined by physical examination, negative pregnancy test, falling pregnancy hormone count and/or, an ultrasound revealing a dead fetus.  Miscarriages are often a very emotional time for prospective mothers and fathers. This is not you or your partners fault. It did not occur because of an inadequacy in you or your partner. Nearly all miscarriages occur because the pregnancy has started off wrongly. At least half of these pregnancies have a chromosomal abnormality. It is almost always not inherited. Others may have developmental problems with the fetus or placenta. This does not always show up even when the products miscarried are studied under the microscope. The miscarriage is nearly always not your fault and it is not likely that you could have prevented it from happening. If you are having emotional and grieving problems, talk to your health care provider and even seek counseling, if necessary, before getting pregnant again. You can begin trying for another pregnancy as soon as your caregiver says it is OK.  HOME CARE INSTRUCTIONS   · Your caregiver may order   bed rest depending on how much bleeding and cramping you are having. You may be limited to only getting up to go to the bathroom. You may be allowed to continue light activity. You may need to make arrangements for the care of your other children and for any other responsibilities.  · Keep track of the number of pads you use each day, how often you have to change pads and how  saturated (soaked) they are. Record this information.  · DO NOT USE TAMPONS. Do not douche, have sexual intercourse or orgasms until approved by your caregiver.  · You may receive a follow up appointment for re-evaluation of your pregnancy and a repeat blood test. Re-evaluation often occurs after 2 days and again in 4 to 6 weeks. It is very important that you follow-up in the recommended time period.  · If you are Rh negative and the father is Rh positive or you do not know the fathers' blood type, you may receive a shot (Rh immune globulin) to help prevent abnormal antibodies that can develop and affect the baby in any future pregnancies.  SEEK IMMEDIATE MEDICAL CARE IF:  · You have severe cramps in your stomach, back, or abdomen.  · You have a sudden onset of severe pain in the lower part of your abdomen.  · You develop chills.  · You run an unexplained temperature of 101° F (38.3° C) or higher.  · You pass large clots or tissue. Save any tissue for your caregiver to inspect.  · Your bleeding increases or you become light-headed, weak, or have fainting episodes.  · You have a gush of fluid from your vagina.  · You pass out. This could mean you have a tubal (ectopic) pregnancy.  Document Released: 09/26/2005 Document Revised: 12/19/2011 Document Reviewed: 05/12/2008  ExitCare® Patient Information ©2014 [employer].

## 2013-11-04 ENCOUNTER — Inpatient Hospital Stay (HOSPITAL_COMMUNITY)
Admission: AD | Admit: 2013-11-04 | Discharge: 2013-11-04 | Disposition: A | Discharge status: Home / Self Care | Payer: No Typology Code available for payment source | Source: Ambulatory Visit | Attending: Obstetrics & Gynecology | Admitting: Obstetrics & Gynecology

## 2013-11-04 DIAGNOSIS — O039 Complete or unspecified spontaneous abortion without complication: Secondary | ICD-10-CM | POA: Insufficient documentation

## 2013-11-04 DIAGNOSIS — O021 Missed abortion: Secondary | ICD-10-CM

## 2013-11-04 LAB — HCG, QUANTITATIVE, PREGNANCY: hCG, Beta Chain, Quant, S: 12 m[IU]/mL — ABNORMAL HIGH (ref <5)

## 2013-11-04 NOTE — Discharge Instructions (Signed)
Miscarriage  A miscarriage is the loss of an unborn baby (fetus) before the 20th week of pregnancy. The cause is often unknown.   HOME CARE  · You may need to stay in bed (bed rest), or you may be able to do light activity. Go about activity as told by your doctor.  · Have help at home.  · Write down how many pads you use each day. Write down how soaked they are.  · Do not use tampons. Do not wash out your vagina (douche) or have sex (intercourse) until your doctor approves.  · Only take medicine as told by your doctor.  · Do not take aspirin.  · Keep all doctor visits as told.  · If you or your partner have problems with grieving, talk to your doctor. You can also try counseling. Give yourself time to grieve before trying to get pregnant again.  GET HELP RIGHT AWAY IF:  · You have bad cramps or pain in your back or belly (abdomen).  · You have a fever.  · You pass large clumps of blood (clots) from your vagina that are walnut-sized or larger. Save the clumps for your doctor to see.  · You pass large amounts of tissue from your vagina. Save the tissue for your doctor to see.  · You have more bleeding.  · You have thick, bad-smelling fluid (discharge) coming from the vagina.  · You get lightheaded, weak, or you pass out (faint).  · You have chills.  MAKE SURE YOU:  · Understand these instructions.  · Will watch your condition.  · Will get help right away if you are not doing well or get worse.  Document Released: 12/19/2011 Document Reviewed: 12/19/2011  ExitCare® Patient Information ©2014 [employer].

## 2013-11-04 NOTE — MAU Note (Signed)
Patient to MAU for follow up BHCG. Patient states she continues to have some bleeding with mild cramping.

## 2013-11-04 NOTE — MAU Provider Note (Signed)
History     CSN: [id number]  Arrival date and time: 11/04/13 1708   None     Chief Complaint  Patient presents with  . Follow-up   HPI This is a 33 y.o. female at early gestation for repeat Quant HCG.   States is relieved to be having a miscarriage.  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   RN Note:  Patient to MAU for follow up BHCG. Patient states she continues to have some bleeding with mild cramping.      MAU note from 11/01/13: Ms. Deveney is a 33 yo [id number] with a hx of an incompitant cervix s/p abdominal cerclage who presents for vaginal bleeding and pain. Abdominal cerclage placed in 2009.  LMP 12/17  Took two HPT and were positive on 1/19. Hasn't established care yet.  This evening, she started noticing she was bleeding. Having some abdominal cramping. Passing clots. Seems to be getting heavier and more crampy in pain.  exam relatively benign with only midline tenderness. significant bleeding in the vaginal vault. HCG of 125. Suspect most likely miscarriage.  - given HCG level, [place] would not be helpful in delineating location of the pregnancy. Discussed this with pt.      OB History   Grav Para Term Preterm Abortions TAB SAB Ect Mult Living   4 2 1 1 2 1 1   0 1      Past Medical History  Diagnosis Date  . Kidney stone   . History of lithotripsy   . H/O dilation and curettage     Past Surgical History  Procedure Laterality Date  . Lithotripsy    . Abdominal cerclage  2009  . Cesarean section    . Dilation and curettage of uterus      Family History  Problem Relation Age of Onset  . Asthma Mother     History  Substance Use Topics  . Smoking status: Never Smoker   . Smokeless tobacco: Never Used  . Alcohol Use: No    Allergies:  Allergies  Allergen Reactions  . Ciprofloxacin Nausea And Vomiting    Prescriptions prior to admission  Medication Sig Dispense Refill  . oxyCODONE-acetaminophen (PERCOCET/ROXICET) 5-325 MG per  tablet Take 1-2 tablets by mouth every 6 (six) hours as needed.  15 tablet  0    Review of Systems  Constitutional: Negative for fever, chills and malaise/fatigue.  Gastrointestinal: Negative for abdominal pain.  Genitourinary:       Some spotting   Physical Exam   Blood pressure 110/61, pulse 80, resp. rate 16, SpO2 100.00%.  Physical Exam  Constitutional: She is oriented to person, place, and time. She appears well-developed and well-nourished. No distress.  Cardiovascular: Normal rate.   Respiratory: Effort normal.  Musculoskeletal: Normal range of motion.  Neurological: She is alert and oriented to person, place, and time.  Skin: Skin is warm and dry.  Psychiatric: She has a normal mood and affect.    MAU Course  Procedures  MDM Results for orders placed during the hospital encounter of 11/04/13 (from the past 24 hour(s))  HCG, QUANTITATIVE, PREGNANCY     Status: Abnormal   Collection Time    11/04/13  5:34 PM      Result Value Range   hCG, Beta Chain, Quant, S 12 (*) <5 mIU/mL   Last Quant was 125  Assessment and Plan  A:  Failed Pregnancy      Falling Quant HCG  P:  Discussed findings      Will have her followup in clinic 2-4 weeks to arrange contraception.  Hansel Feinstein 11/04/2013, 6:16 PM

## 2013-11-06 NOTE — MAU Provider Note (Signed)
Attestation of Attending Supervision of Advanced Practitioner: Evaluation and management procedures were performed by the PA/NP/CNM/OB Fellow under my supervision/collaboration. Chart reviewed and agree with management and plan.  Asley Baskerville V 11/06/2013 5:24 PM

## 2013-11-08 ENCOUNTER — Encounter: Payer: Self-pay | Admitting: Nurse Practitioner

## 2013-11-29 ENCOUNTER — Encounter: Payer: 59 | Admitting: Nurse Practitioner

## 2014-04-23 ENCOUNTER — Encounter (HOSPITAL_BASED_OUTPATIENT_CLINIC_OR_DEPARTMENT_OTHER): Payer: Self-pay | Admitting: Emergency Medicine

## 2014-04-23 ENCOUNTER — Emergency Department (HOSPITAL_BASED_OUTPATIENT_CLINIC_OR_DEPARTMENT_OTHER)
Admission: EM | Admit: 2014-04-23 | Discharge: 2014-04-23 | Disposition: A | Discharge status: Home / Self Care | Payer: No Typology Code available for payment source | Attending: Emergency Medicine | Admitting: Emergency Medicine

## 2014-04-23 DIAGNOSIS — Z87442 Personal history of urinary calculi: Secondary | ICD-10-CM | POA: Insufficient documentation

## 2014-04-23 DIAGNOSIS — Z9889 Other specified postprocedural states: Secondary | ICD-10-CM | POA: Insufficient documentation

## 2014-04-23 DIAGNOSIS — K0381 Cracked tooth: Secondary | ICD-10-CM | POA: Insufficient documentation

## 2014-04-23 DIAGNOSIS — K029 Dental caries, unspecified: Secondary | ICD-10-CM

## 2014-04-23 MED ORDER — PENICILLIN V POTASSIUM 250 MG PO TABS: 500.0000 mg | ORAL_TABLET | Freq: Four times a day (QID) | ORAL | Status: DC

## 2014-04-23 MED ORDER — PENICILLIN V POTASSIUM 250 MG PO TABS: 500.0000 mg | ORAL_TABLET | Freq: Four times a day (QID) | ORAL | Status: AC

## 2014-04-23 MED ORDER — OXYCODONE-ACETAMINOPHEN 5-325 MG PO TABS: 1.0000 | ORAL_TABLET | Freq: Four times a day (QID) | ORAL | Status: DC | PRN

## 2014-04-23 MED ORDER — FLUCONAZOLE 150 MG PO TABS: 150.0000 mg | ORAL_TABLET | Freq: Once | ORAL | Status: AC

## 2014-04-23 NOTE — ED Provider Notes (Signed)
CSN: [id number]     Arrival date & time 04/23/14  4665 History   First MD Initiated Contact with Patient 04/23/14 1001     Chief Complaint  Patient presents with  . Dental Pain     (Consider location/radiation/quality/duration/timing/severity/associated sxs/prior Treatment) Patient is a 33 y.o. female presenting with tooth pain. The history is provided by the patient.  Dental Pain Location:  Lower Lower teeth location:  17/LL 3rd molar Associated symptoms: no fever    patient has pain in her left lower wisdom tooth. She states that she was told years ago she should have it removed. She did not. She states it has been hurting in the last few days. No fevers. She states she called her dentist and could not get in today.  Past Medical History  Diagnosis Date  . Kidney stone   . History of lithotripsy   . H/O dilation and curettage    Past Surgical History  Procedure Laterality Date  . Lithotripsy    . Abdominal cerclage  2009  . Cesarean section    . Dilation and curettage of uterus     Family History  Problem Relation Age of Onset  . Asthma Mother    History  Substance Use Topics  . Smoking status: Never Smoker   . Smokeless tobacco: Never Used  . Alcohol Use: No   OB History   Grav Para Term Preterm Abortions TAB SAB Ect Mult Living   4 2 1 1 2 1 1   0 1     Review of Systems  Constitutional: Negative for fever and chills.  HENT: Positive for dental problem. Negative for sinus pressure, sore throat and trouble swallowing.       Allergies  Ciprofloxacin  Home Medications   Prior to Admission medications   Medication Sig Start Date End Date Taking? Authorizing Provider  oxyCODONE-acetaminophen (PERCOCET/ROXICET) 5-325 MG per tablet Take 1-2 tablets by mouth every 6 (six) hours as needed. 11/02/13   Kassie Mends, MD  oxyCODONE-acetaminophen (PERCOCET/ROXICET) 5-325 MG per tablet Take 1-2 tablets by mouth every 6 (six) hours as needed for severe pain. 04/23/14    Jasper Riling. Georgianna Band, MD  penicillin v potassium (VEETID) 250 MG tablet Take 2 tablets (500 mg total) by mouth 4 (four) times daily. 04/23/14 04/30/14  Jasper Riling. Ruvi Fullenwider, MD   BP 117/78  Pulse 78  Temp(Src) 98.4 F (36.9 C) (Oral)  Resp 18  SpO2 99% Physical Exam  HENT:  Left lower wisdom tooth is somewhat impacted and broken off near the gum line. No swelling of jaw. No fluctuance. No swelling of floor of mouth.  Pulmonary/Chest: Effort normal.    ED Course  Procedures (including critical care time) Labs Review Labs Reviewed - No data to display  Imaging Review No results found.   EKG Interpretation None      MDM   Final diagnoses:  Pain due to dental caries    patient has broken off tooth. No abscess. He has dental followup. Will discharge home    Jasper Riling. Alvino Chapel, MD 04/23/14 1042

## 2014-04-23 NOTE — Discharge Instructions (Signed)
Dental Caries  Dental caries (also called tooth decay) is the most common oral disease. It can occur at any age, but is more common in children and young adults.  HOW DENTAL CARIES DEVELOPS  The process of decay begins when bacteria and foods (particularly sugars and starches) combine in your mouth to produce plaque. Plaque is a substance that sticks to the hard, outer surface of a tooth (enamel). The bacteria in plaque produce acids that attack enamel. These acids may also attack the root surface of a tooth (cementum) if it is exposed. Repeated attacks dissolve these surfaces and create holes in the tooth (cavities). If left untreated, the acids destroy the other layers of the tooth.  RISK FACTORS  Frequent sipping of sugary beverages.   Frequent snacking on sugary and starchy foods, especially those that easily get stuck in the teeth.   Poor oral hygiene.   Dry mouth.   Substance abuse such as methamphetamine abuse.   Broken or poor-fitting dental restorations.   Eating disorders.   Gastroesophageal reflux disease (GERD).   Certain radiation treatments to the head and neck. SYMPTOMS In the early stages of dental caries, symptoms are seldom present. Sometimes white, chalky areas may be seen on the enamel or other tooth layers. In later stages, symptoms may include:  Pits and holes on the enamel.  Toothache after sweet, hot, or cold foods or drinks are consumed.  Pain around the tooth.  Swelling around the tooth. DIAGNOSIS  Most of the time, dental caries is detected during a regular dental checkup. A diagnosis is made after a thorough medical and dental history is taken and the surfaces of your teeth are checked for signs of dental caries. Sometimes special instruments, such as lasers, are used to check for dental caries. Dental X-ray exams may be taken so that areas not visible to the eye (such as between the contact areas of the teeth) can be checked for cavities.    TREATMENT  If dental caries is in its early stages, it may be reversed with a fluoride treatment or an application of a remineralizing agent at the dental office. Thorough brushing and flossing at home is needed to aid these treatments. If it is in its later stages, treatment depends on the location and extent of tooth destruction:   If a small area of the tooth has been destroyed, the destroyed area will be removed and cavities will be filled with a material such as gold, silver amalgam, or composite resin.   If a large area of the tooth has been destroyed, the destroyed area will be removed and a cap (crown) will be fitted over the remaining tooth structure.   If the center part of the tooth (pulp) is affected, a procedure called a root canal will be needed before a filling or crown can be placed.   If most of the tooth has been destroyed, the tooth may need to be pulled (extracted). HOME CARE INSTRUCTIONS You can prevent, stop, or reverse dental caries at home by practicing good oral hygiene. Good oral hygiene includes:  Thoroughly cleaning your teeth at least twice a day with a toothbrush and dental floss.   Using a fluoride toothpaste. A fluoride mouth rinse may also be used if recommended by your dentist or health care provider.   Restricting the amount of sugary and starchy foods and sugary liquids you consume.   Avoiding frequent snacking on these foods and sipping of these liquids.   Keeping regular visits  with a dentist for checkups and cleanings. PREVENTION   Practice good oral hygiene.  Consider a dental sealant. A dental sealant is a coating material that is applied by your dentist to the pits and grooves of teeth. The sealant prevents food from being trapped in them. It may protect the teeth for several years.  Ask about fluoride supplements if you live in a community without fluorinated water or with water that has a low fluoride content. Use fluoride supplements  as directed by your dentist or health care provider.  Allow fluoride varnish applications to teeth if directed by your dentist or health care provider. Document Released: 06/18/2002 Document Revised: 05/29/2013 Document Reviewed: 09/28/2012 [facility] Patient Information 2015 [place], [place]. This information is not intended to replace advice given to you by your health care provider. Make sure you discuss any questions you have with your health care provider.  Dental Pain A tooth ache may be caused by cavities (tooth decay). Cavities expose the nerve of the tooth to air and hot or cold temperatures. It may come from an infection or abscess (also called a boil or furuncle) around your tooth. It is also often caused by dental caries (tooth decay). This causes the pain you are having. DIAGNOSIS  Your caregiver can diagnose this problem by exam. TREATMENT   If caused by an infection, it may be treated with medications which kill germs (antibiotics) and pain medications as prescribed by your caregiver. Take medications as directed.  Only take over-the-counter or prescription medicines for pain, discomfort, or fever as directed by your caregiver.  Whether the tooth ache today is caused by infection or dental disease, you should see your dentist as soon as possible for further care. SEEK MEDICAL CARE IF: The exam and treatment you received today has been provided on an emergency basis only. This is not a substitute for complete medical or dental care. If your problem worsens or new problems (symptoms) appear, and you are unable to meet with your dentist, call or return to this location. SEEK IMMEDIATE MEDICAL CARE IF:   You have a fever.  You develop redness and swelling of your face, jaw, or neck.  You are unable to open your mouth.  You have severe pain uncontrolled by pain medicine. MAKE SURE YOU:   Understand these instructions.  Will watch your condition.  Will get help right away if  you are not doing well or get worse. Document Released: 09/26/2005 Document Revised: 12/19/2011 Document Reviewed: 05/14/2008 [facility] Patient Information 2015 [place], [place]. This information is not intended to replace advice given to you by your health care provider. Make sure you discuss any questions you have with your health care provider.

## 2014-04-23 NOTE — ED Notes (Signed)
Patient having dental pain, started a few weeks ago. Left bottom wisdom tooth.

## 2014-07-28 ENCOUNTER — Encounter (HOSPITAL_COMMUNITY): Payer: Self-pay | Admitting: *Deleted

## 2014-07-28 ENCOUNTER — Inpatient Hospital Stay (HOSPITAL_COMMUNITY): Payer: 59

## 2014-07-28 ENCOUNTER — Inpatient Hospital Stay (HOSPITAL_COMMUNITY)
Admission: AD | Admit: 2014-07-28 | Discharge: 2014-07-28 | Disposition: A | Discharge status: Home / Self Care | Payer: 59 | Source: Ambulatory Visit | Attending: Family Medicine | Admitting: Family Medicine

## 2014-07-28 DIAGNOSIS — R102 Pelvic and perineal pain: Secondary | ICD-10-CM

## 2014-07-28 DIAGNOSIS — N83299 Other ovarian cyst, unspecified side: Secondary | ICD-10-CM

## 2014-07-28 DIAGNOSIS — R1031 Right lower quadrant pain: Secondary | ICD-10-CM | POA: Insufficient documentation

## 2014-07-28 DIAGNOSIS — N8320 Unspecified ovarian cysts: Secondary | ICD-10-CM

## 2014-07-28 LAB — CBC WITH DIFFERENTIAL/PLATELET
BASOS ABS: 0 10*3/uL (ref 0.0–0.1)
Basophils Relative: 0 % (ref 0–1)
EOS PCT: 2 % (ref 0–5)
Eosinophils Absolute: 0.1 10*3/uL (ref 0.0–0.7)
HCT: 38.2 % (ref 36.0–46.0)
Hemoglobin: 12.3 g/dL (ref 12.0–15.0)
Lymphocytes Relative: 35 % (ref 12–46)
Lymphs Abs: 2.5 10*3/uL (ref 0.7–4.0)
MCH: 30.1 pg (ref 26.0–34.0)
MCHC: 32.2 g/dL (ref 30.0–36.0)
MCV: 93.6 fL (ref 78.0–100.0)
Monocytes Absolute: 0.6 10*3/uL (ref 0.1–1.0)
Monocytes Relative: 9 % (ref 3–12)
NEUTROS ABS: 3.9 10*3/uL (ref 1.7–7.7)
Neutrophils Relative %: 54 % (ref 43–77)
PLATELETS: 258 10*3/uL (ref 150–400)
RBC: 4.08 MIL/uL (ref 3.87–5.11)
RDW: 13.8 % (ref 11.5–15.5)
WBC: 7.2 10*3/uL (ref 4.0–10.5)

## 2014-07-28 LAB — URINALYSIS, ROUTINE W REFLEX MICROSCOPIC
Bilirubin Urine: NEGATIVE
Glucose, UA: NEGATIVE mg/dL
HGB URINE DIPSTICK: NEGATIVE
KETONES UR: NEGATIVE mg/dL
Leukocytes, UA: NEGATIVE
Nitrite: NEGATIVE
PROTEIN: NEGATIVE mg/dL
Specific Gravity, Urine: 1.02 (ref 1.005–1.030)
Urobilinogen, UA: 0.2 mg/dL (ref 0.0–1.0)
pH: 6 (ref 5.0–8.0)

## 2014-07-28 LAB — POCT PREGNANCY, URINE: Preg Test, Ur: NEGATIVE

## 2014-07-28 LAB — WET PREP, GENITAL
CLUE CELLS WET PREP: NONE SEEN
TRICH WET PREP: NONE SEEN
WBC WET PREP: NONE SEEN
YEAST WET PREP: NONE SEEN

## 2014-07-28 MED ORDER — OXYCODONE-ACETAMINOPHEN 5-325 MG PO TABS: 1.0000 | ORAL_TABLET | Freq: Four times a day (QID) | ORAL | Status: DC | PRN

## 2014-07-28 NOTE — MAU Note (Signed)
Patient states she has been having right lower abdominal pain since last week. Is not as painful as it has been but is persistant. Had been having back pain but not now. Denies bleeding, nausea, vomiting or fever and has a normal discharge.

## 2014-07-28 NOTE — MAU Provider Note (Signed)
Attestation of Attending Supervision of Advanced Practitioner (PA/CNM/NP): Evaluation and management procedures were performed by the Advanced Practitioner under my supervision and collaboration.  I have reviewed the Advanced Practitioner's note and chart, and I agree with the management and plan.  Omer Puccinelli, DO Attending Physician Faculty Practice, [facility]  

## 2014-07-28 NOTE — Discharge Instructions (Signed)
Ovarian Cyst An ovarian cyst is a fluid-filled sac that forms on an ovary. The ovaries are small organs that produce eggs in women. Various types of cysts can form on the ovaries. Most are not cancerous. Many do not cause problems, and they often go away on their own. Some may cause symptoms and require treatment. Common types of ovarian cysts include:  Functional cysts--These cysts may occur every month during the menstrual cycle. This is normal. The cysts usually go away with the next menstrual cycle if the woman does not get pregnant. Usually, there are no symptoms with a functional cyst.  Endometrioma cysts--These cysts form from the tissue that lines the uterus. They are also called "chocolate cysts" because they become filled with blood that turns brown. This type of cyst can cause pain in the lower abdomen during intercourse and with your menstrual period.  Cystadenoma cysts--This type develops from the cells on the outside of the ovary. These cysts can get very big and cause lower abdomen pain and pain with intercourse. This type of cyst can twist on itself, cut off its blood supply, and cause severe pain. It can also easily rupture and cause a lot of pain.  Dermoid cysts--This type of cyst is sometimes found in both ovaries. These cysts may contain different kinds of body tissue, such as skin, teeth, hair, or cartilage. They usually do not cause symptoms unless they get very big.  Theca lutein cysts--These cysts occur when too much of a certain hormone (human chorionic gonadotropin) is produced and overstimulates the ovaries to produce an egg. This is most common after procedures used to assist with the conception of a baby (in vitro fertilization). CAUSES   Fertility drugs can cause a condition in which multiple large cysts are formed on the ovaries. This is called ovarian hyperstimulation syndrome.  A condition called polycystic ovary syndrome can cause hormonal imbalances that can lead to  nonfunctional ovarian cysts. SIGNS AND SYMPTOMS  Many ovarian cysts do not cause symptoms. If symptoms are present, they may include:  Pelvic pain or pressure.  Pain in the lower abdomen.  Pain during sexual intercourse.  Increasing girth (swelling) of the abdomen.  Abnormal menstrual periods.  Increasing pain with menstrual periods.  Stopping having menstrual periods without being pregnant. DIAGNOSIS  These cysts are commonly found during a routine or annual pelvic exam. Tests may be ordered to find out more about the cyst. These tests may include:  Ultrasound.  X-ray of the pelvis.  CT scan.  MRI.  Blood tests. TREATMENT  Many ovarian cysts go away on their own without treatment. Your health care provider may want to check your cyst regularly for 2-3 months to see if it changes. For women in menopause, it is particularly important to monitor a cyst closely because of the higher rate of ovarian cancer in menopausal women. When treatment is needed, it may include any of the following:  A procedure to drain the cyst (aspiration). This may be done using a long needle and ultrasound. It can also be done through a laparoscopic procedure. This involves using a thin, lighted tube with a tiny camera on the end (laparoscope) inserted through a small incision.  Surgery to remove the whole cyst. This may be done using laparoscopic surgery or an open surgery involving a larger incision in the lower abdomen.  Hormone treatment or birth control pills. These methods are sometimes used to help dissolve a cyst. HOME CARE INSTRUCTIONS   Only take over-the-counter   or prescription medicines as directed by your health care provider.  Follow up with your health care provider as directed.  Get regular pelvic exams and Pap tests. SEEK MEDICAL CARE IF:   Your periods are late, irregular, or painful, or they stop.  Your pelvic pain or abdominal pain does not go away.  Your abdomen becomes  larger or swollen.  You have pressure on your bladder or trouble emptying your bladder completely.  You have pain during sexual intercourse.  You have feelings of fullness, pressure, or discomfort in your stomach.  You lose weight for no apparent reason.  You feel generally ill.  You become constipated.  You lose your appetite.  You develop acne.  You have an increase in body and facial hair.  You are gaining weight, without changing your exercise and eating habits.  You think you are pregnant. SEEK IMMEDIATE MEDICAL CARE IF:   You have increasing abdominal pain.  You feel sick to your stomach (nauseous), and you throw up (vomit).  You develop a fever that comes on suddenly.  You have abdominal pain during a bowel movement.  Your menstrual periods become heavier than usual. MAKE SURE YOU:  Understand these instructions.  Will watch your condition.  Will get help right away if you are not doing well or get worse. Document Released: 09/26/2005 Document Revised: 10/01/2013 Document Reviewed: 06/03/2013 [employer] Patient Information 2015 [employer]. This information is not intended to replace advice given to you by your health care provider. Make sure you discuss any questions you have with your health care provider.  

## 2014-07-28 NOTE — MAU Provider Note (Signed)
History     CSN: [id number]  Arrival date and time: 07/28/14 1352   First Provider Initiated Contact with Patient 07/28/14 1512      Chief Complaint  Patient presents with  . Abdominal Pain   HPI This is a 33 y.o. female who presents with c/o RLQ pain since last week. States was more painful last week and had back pain with it. The back pain has gone away. Denies N/V/D/C or fever. No abnormal bleeding.  History remarkable for kidney stones and abdominal cerclage RN Note: Patient states she has been having right lower abdominal pain since last week. Is not as painful as it has been but is persistant. Had been having back pain but not now. Denies bleeding, nausea, vomiting or fever and has a normal discharge.        OB History   Grav Para Term Preterm Abortions TAB SAB Ect Mult Living   4 2 1 1 2 1 1   0 1      Past Medical History  Diagnosis Date  . Kidney stone   . History of lithotripsy   . H/O dilation and curettage     Past Surgical History  Procedure Laterality Date  . Lithotripsy    . Abdominal cerclage  2009  . Cesarean section    . Dilation and curettage of uterus      Family History  Problem Relation Age of Onset  . Asthma Mother     History  Substance Use Topics  . Smoking status: Never Smoker   . Smokeless tobacco: Never Used  . Alcohol Use: No    Allergies:  Allergies  Allergen Reactions  . Ciprofloxacin Nausea And Vomiting    Prescriptions prior to admission  Medication Sig Dispense Refill  . ibuprofen (ADVIL,MOTRIN) 200 MG tablet Take 800 mg by mouth every 4 (four) hours as needed for headache or moderate pain.      Marland Kitchen ibuprofen (ADVIL,MOTRIN) 800 MG tablet Take 800 mg by mouth every 8 (eight) hours as needed (Used for tooth pain.).      . [DISCONTINUED] oxyCODONE-acetaminophen (PERCOCET/ROXICET) 5-325 MG per tablet Take 1-2 tablets by mouth every 6 (six) hours as needed.  15 tablet  0  . [DISCONTINUED] oxyCODONE-acetaminophen  (PERCOCET/ROXICET) 5-325 MG per tablet Take 1-2 tablets by mouth every 6 (six) hours as needed for severe pain.  10 tablet  0    Review of Systems  Constitutional: Negative for fever, chills and malaise/fatigue.  Gastrointestinal: Positive for abdominal pain (localized to RLQ). Negative for nausea, vomiting, diarrhea and constipation.  Genitourinary: Negative for dysuria.  Musculoskeletal: Positive for back pain (last week but not now).  Neurological: Negative for weakness.   Physical Exam   Blood pressure 148/86, pulse 78, temperature 98.5 F (36.9 C), temperature source Oral, resp. rate 16, height 5\' 5"  (1.651 m), weight 189 lb 9.6 oz (86.002 kg), last menstrual period 07/10/2014, SpO2 100.00%.  Physical Exam  Constitutional: She is oriented to person, place, and time. She appears well-developed and well-nourished. No distress.  HENT:  Head: Normocephalic.  Cardiovascular: Normal rate.   Respiratory: Effort normal.  GI: Soft. She exhibits no distension and no mass. There is tenderness (To deep palpation, but no guarding or rebound). There is no rebound and no guarding.  Genitourinary: Uterus normal. Vaginal discharge (creamy white) found.  Musculoskeletal: Normal range of motion.  Neurological: She is alert and oriented to person, place, and time.  Skin: Skin is warm and dry.  Psychiatric: She  has a normal mood and affect.    MAU Course  Procedures  MDM CBC, cultures, and [place] ordered  Results for orders placed during the hospital encounter of 07/28/14 (from the past 24 hour(s))  URINALYSIS, ROUTINE W REFLEX MICROSCOPIC     Status: None   Collection Time    07/28/14  2:20 PM      Result Value Ref Range   Color, Urine YELLOW  YELLOW   APPearance CLEAR  CLEAR   Specific Gravity, Urine 1.020  1.005 - 1.030   pH 6.0  5.0 - 8.0   Glucose, UA NEGATIVE  NEGATIVE mg/dL   Hgb urine dipstick NEGATIVE  NEGATIVE   Bilirubin Urine NEGATIVE  NEGATIVE   Ketones, ur NEGATIVE  NEGATIVE  mg/dL   Protein, ur NEGATIVE  NEGATIVE mg/dL   Urobilinogen, UA 0.2  0.0 - 1.0 mg/dL   Nitrite NEGATIVE  NEGATIVE   Leukocytes, UA NEGATIVE  NEGATIVE  POCT PREGNANCY, URINE     Status: None   Collection Time    07/28/14  2:31 PM      Result Value Ref Range   Preg Test, Ur NEGATIVE  NEGATIVE  WET PREP, GENITAL     Status: None   Collection Time    07/28/14  3:30 PM      Result Value Ref Range   Yeast Wet Prep HPF POC NONE SEEN  NONE SEEN   Trich, Wet Prep NONE SEEN  NONE SEEN   Clue Cells Wet Prep HPF POC NONE SEEN  NONE SEEN   WBC, Wet Prep HPF POC NONE SEEN  NONE SEEN  CBC WITH DIFFERENTIAL     Status: None   Collection Time    07/28/14  3:37 PM      Result Value Ref Range   WBC 7.2  4.0 - 10.5 K/uL   RBC 4.08  3.87 - 5.11 MIL/uL   Hemoglobin 12.3  12.0 - 15.0 g/dL   HCT 38.2  36.0 - 46.0 %   MCV 93.6  78.0 - 100.0 fL   MCH 30.1  26.0 - 34.0 pg   MCHC 32.2  30.0 - 36.0 g/dL   RDW 13.8  11.5 - 15.5 %   Platelets 258  150 - 400 K/uL   Neutrophils Relative % 54  43 - 77 %   Neutro Abs 3.9  1.7 - 7.7 K/uL   Lymphocytes Relative 35  12 - 46 %   Lymphs Abs 2.5  0.7 - 4.0 K/uL   Monocytes Relative 9  3 - 12 %   Monocytes Absolute 0.6  0.1 - 1.0 K/uL   Eosinophils Relative 2  0 - 5 %   Eosinophils Absolute 0.1  0.0 - 0.7 K/uL   Basophils Relative 0  0 - 1 %   Basophils Absolute 0.0  0.0 - 0.1 K/uL   US Pelvis Complete  07/28/2014   CLINICAL DATA:  Right lower quadrant abdominal and pelvic pain  EXAM: TRANSABDOMINAL AND TRANSVAGINAL ULTRASOUND OF PELVIS  TECHNIQUE: Both transabdominal and transvaginal ultrasound examinations of the pelvis were performed. Transabdominal technique was performed for global imaging of the pelvis including uterus, ovaries, adnexal regions, and pelvic cul-de-sac. It was necessary to proceed with endovaginal exam following the transabdominal exam to visualize the endometrium and adnexa in better detail.  COMPARISON:  11/09/2007    FINDINGS: Uterus   Measurements: 10.4 x 5.3 x 5.9 cm. No fibroids or other mass visualized.  Endometrium  Thickness: 9.3 mm.  No focal abnormality visualized.                      Right ovary  Measurements: 4.7 x 4.0 x 4.3 cm. Heterogeneous complex hypoechoic cyst with internal debris measures 3.9 x 3.3 x 3.5 cm, compatible with a complex cyst with hemorrhagic or proteinaceous debris.                       Left ovary  Measurements: 4.6 x 2.0 x 3.6 cm. Numerous small follicles noted. Normal appearance. No adnexal abnormality.                       Other findings  Trace pelvic free fluid, likely physiologic.    IMPRESSION: 3.9 cm complex heterogeneous right ovarian cyst with internal debris suspicious for internal hemorrhage or proteinaceous debris.  trace pelvic free fluid likely physiologic.  uterus, endometrium, and left ovary are normal in appearance.     Electronically Signed   By: Daryll Brod M.D.   On: 07/28/2014 17:01   Assessment and Plan  A:  Right ovarian complex cyst.   P:  Discussed with Dr Nehemiah Settle       Discharge home       Rx Percocet for pain       Advised followup in clinic with MD for plan of followup, may or may not need surgery. Note sent to clinic       [facility] 07/28/2014, 3:59 PM

## 2014-07-29 ENCOUNTER — Encounter: Payer: Self-pay | Admitting: Obstetrics and Gynecology

## 2014-07-29 LAB — GC/CHLAMYDIA PROBE AMP
CT Probe RNA: NEGATIVE
GC Probe RNA: NEGATIVE

## 2014-07-29 LAB — HIV ANTIBODY (ROUTINE TESTING W REFLEX): HIV: NONREACTIVE

## 2014-08-11 ENCOUNTER — Encounter (HOSPITAL_COMMUNITY): Payer: Self-pay | Admitting: *Deleted

## 2014-09-10 ENCOUNTER — Encounter: Payer: Self-pay | Admitting: Obstetrics and Gynecology

## 2014-09-10 ENCOUNTER — Ambulatory Visit (INDEPENDENT_AMBULATORY_CARE_PROVIDER_SITE_OTHER): Payer: BC Managed Care – PPO | Admitting: Obstetrics and Gynecology

## 2014-09-10 VITALS — BP 117/69 | HR 82 | Temp 98.2°F | Ht 65.0 in | Wt 187.1 lb

## 2014-09-10 DIAGNOSIS — N8320 Unspecified ovarian cysts: Secondary | ICD-10-CM

## 2014-09-10 DIAGNOSIS — N83201 Unspecified ovarian cyst, right side: Secondary | ICD-10-CM

## 2014-09-10 MED ORDER — OXYCODONE-ACETAMINOPHEN 5-325 MG PO TABS: 1.0000 | ORAL_TABLET | Freq: Four times a day (QID) | ORAL | Status: DC | PRN

## 2014-09-10 NOTE — Progress Notes (Signed)
Patient ID: Krystal [name], female   DOB: 06-Aug-1981, 33 y.o.   MRN: [id number] 33 yo [id number] here as an MAU follow up of right ovarian cyst. Patient reports persistent right lower quadrant pain on and off. Patient feels that overall her pain is getting better.  Past Medical History  Diagnosis Date  . Kidney stone   . History of lithotripsy   . H/O dilation and curettage    Past Surgical History  Procedure Laterality Date  . Lithotripsy    . Abdominal cerclage  2009  . Cesarean section    . Dilation and curettage of uterus     Family History  Problem Relation Age of Onset  . Asthma Mother    History  Substance Use Topics  . Smoking status: Never Smoker   . Smokeless tobacco: Never Used  . Alcohol Use: No   GENERAL: Well-developed, well-nourished female in no acute distress.  ABDOMEN: Soft, nontender, nondistended. No organomegaly. PELVIC: Normal external female genitalia. Vagina is pink and rugated.  Normal discharge. Normal appearing cervix. Uterus is normal in size. Right adnexal tenderness. EXTREMITIES: No cyanosis, clubbing, or edema, 2+ distal pulses.  US Pelvis Complete  07/28/2014 CLINICAL DATA: Right lower quadrant abdominal and pelvic pain EXAM: TRANSABDOMINAL AND TRANSVAGINAL ULTRASOUND OF PELVIS TECHNIQUE: Both transabdominal and transvaginal ultrasound examinations of the pelvis were performed. Transabdominal technique was performed for global imaging of the pelvis including uterus, ovaries, adnexal regions, and pelvic cul-de-sac. It was necessary to proceed with endovaginal exam following the transabdominal exam to visualize the endometrium and adnexa in better detail. COMPARISON: 11/09/2007   FINDINGS: Uterus Measurements: 10.4 x 5.3 x 5.9 cm. No fibroids or other mass visualized.   Endometrium Thickness: 9.3 mm. No focal abnormality visualized.   Right ovary Measurements: 4.7 x 4.0 x 4.3 cm. Heterogeneous complex  hypoechoic cyst with internal debris measures 3.9 x 3.3 x 3.5 cm, compatible with a complex cyst with hemorrhagic or proteinaceous debris.   Left ovary Measurements: 4.6 x 2.0 x 3.6 cm. Numerous small follicles noted. Normal appearance. No adnexal abnormality.   Other findings Trace pelvic free fluid, likely physiologic.   IMPRESSION: 3.9 cm complex heterogeneous right ovarian cyst with internal debris suspicious for internal hemorrhage or proteinaceous debris. trace pelvic free fluid likely physiologic. uterus, endometrium, and left ovary are normal in appearance.   Electronically Signed By: Daryll Brod M.D. On: 07/28/2014 17:01   A/P 33 yo with right ovarian cyst - Continue pain management with ibuprofen and percocet - Pelvic ultrasound ordered - Patient will be contacted with results and further management if needed - Discussed birth control options to help prevent recurrence of cysts- Patient opted OCP which she has used in the past without complications.

## 2014-09-17 ENCOUNTER — Ambulatory Visit (HOSPITAL_COMMUNITY)
Admission: RE | Admit: 2014-09-17 | Discharge: 2014-09-17 | Disposition: A | Discharge status: Home / Self Care | Payer: BC Managed Care – PPO | Source: Ambulatory Visit | Attending: Obstetrics and Gynecology | Admitting: Obstetrics and Gynecology

## 2014-09-17 DIAGNOSIS — N831 Corpus luteum cyst: Secondary | ICD-10-CM | POA: Insufficient documentation

## 2014-09-17 DIAGNOSIS — N8320 Unspecified ovarian cysts: Secondary | ICD-10-CM | POA: Insufficient documentation

## 2014-09-17 DIAGNOSIS — N83201 Unspecified ovarian cyst, right side: Secondary | ICD-10-CM

## 2014-09-17 DIAGNOSIS — D252 Subserosal leiomyoma of uterus: Secondary | ICD-10-CM | POA: Diagnosis not present

## 2015-06-15 ENCOUNTER — Encounter (HOSPITAL_BASED_OUTPATIENT_CLINIC_OR_DEPARTMENT_OTHER): Payer: Self-pay

## 2015-06-15 ENCOUNTER — Emergency Department (HOSPITAL_BASED_OUTPATIENT_CLINIC_OR_DEPARTMENT_OTHER)
Admission: EM | Admit: 2015-06-15 | Discharge: 2015-06-15 | Disposition: A | Discharge status: Home / Self Care | Payer: BLUE CROSS/BLUE SHIELD | Attending: Emergency Medicine | Admitting: Emergency Medicine

## 2015-06-15 DIAGNOSIS — Z87448 Personal history of other diseases of urinary system: Secondary | ICD-10-CM | POA: Diagnosis not present

## 2015-06-15 DIAGNOSIS — K088 Other specified disorders of teeth and supporting structures: Secondary | ICD-10-CM | POA: Diagnosis not present

## 2015-06-15 DIAGNOSIS — Z9889 Other specified postprocedural states: Secondary | ICD-10-CM | POA: Diagnosis not present

## 2015-06-15 DIAGNOSIS — K029 Dental caries, unspecified: Secondary | ICD-10-CM | POA: Diagnosis not present

## 2015-06-15 DIAGNOSIS — K0889 Other specified disorders of teeth and supporting structures: Secondary | ICD-10-CM

## 2015-06-15 MED ORDER — IBUPROFEN 800 MG PO TABS: 800.0000 mg | ORAL_TABLET | Freq: Three times a day (TID) | ORAL | Status: DC

## 2015-06-15 MED ORDER — PENICILLIN V POTASSIUM 500 MG PO TABS: 500.0000 mg | ORAL_TABLET | Freq: Four times a day (QID) | ORAL | Status: AC

## 2015-06-15 NOTE — Discharge Instructions (Signed)
Please take prescriptions of penicillin and ibuprofen as prescribed. Please follow up with a dentist as soon as possible.  Please return to the Emergency Department if symptoms worsen.  [facility] [address] [place], [place] [address] Phone [phone]  [facility] [place], [place], exemplifies the [employer] vision to improve the health and quality of life of all [place] by Regulatory affairs officer with a passion to care for the underserved and by leading the nation in community-based, service learning oral health education.  We are committed to offering comprehensive general dental services for adults, children and special needs patients in a safe, caring and professional setting.   Appointments: Our clinic is open Monday through Friday 8:00 a.m. until 5:00 p.m. The amount of time scheduled for an appointment depends on the patients specific needs. We ask that you keep your appointed time for care or provide 24-hour notice of all appointment changes. Parents or legal guardians must accompany minor children.   Payment for Services: Medicaid and other insurance plans are welcome. Payment for services is due when services are rendered and may be made by cash or credit card. If you have dental insurance, we will assist you with your claim submission.    Emergencies:  Emergency services will be provided Monday through Friday on a walk-in basis.  Please arrive early for emergency services. After hours emergency services will be provided for patients of record as required.   Services:  Personnel officer Dentistry Oral Surgery - Extractions Root Canals Sealants and Tooth Colored Fillings Crowns and Bridges Dentures and Partial Dentures Implant Services Periodontal Services and  Cleanings Cosmetic Risk manager [facility]

## 2015-06-15 NOTE — ED Provider Notes (Signed)
CSN: [id number]     Arrival date & time 06/15/15  1308 History   First MD Initiated Contact with Patient 06/15/15 1412     Chief Complaint  Patient presents with  . Dental Pain     (Consider location/radiation/quality/duration/timing/severity/associated sxs/prior Treatment) HPI Comments: Pt is a 34 yo female who presents to the ED with complaint of left lower molar pain, onset 2 months. Pt reports worsening pain to her left lower molar. She notes that she has had all of her wisdom teeth removed except her left lower wisdom tooth. Denies fever, headache, swelling, drainage, dysphagia, trismus. She notes she has been taking ibuprofen at home with mild relief but ran out. Pt reports that she does not have a dentist.    Past Medical History  Diagnosis Date  . Kidney stone   . History of lithotripsy   . H/O dilation and curettage    Past Surgical History  Procedure Laterality Date  . Lithotripsy    . Abdominal cerclage  2009  . Cesarean section    . Dilation and curettage of uterus     Family History  Problem Relation Age of Onset  . Asthma Mother    Social History  Substance Use Topics  . Smoking status: Never Smoker   . Smokeless tobacco: Never Used  . Alcohol Use: No   OB History    Gravida Para Term Preterm AB TAB SAB Ectopic Multiple Living   4 2 1 1 2 1 1   0 1     Review of Systems  Constitutional: Negative for fever.  HENT: Positive for dental problem. Negative for drooling and facial swelling.   Respiratory: Negative for shortness of breath.   Neurological: Negative for headaches.      Allergies  Ciprofloxacin  Home Medications   Prior to Admission medications   Medication Sig Start Date End Date Taking? Authorizing Provider  ibuprofen (ADVIL,MOTRIN) 800 MG tablet Take 1 tablet (800 mg total) by mouth 3 (three) times daily. 06/15/15   Nona Dell, PA-C  penicillin v potassium (VEETID) 500 MG tablet Take 1 tablet (500 mg total) by mouth 4 (four)  times daily. 06/15/15 06/22/15  Chesley Noon Nadeau, PA-C   BP 116/94 mmHg  Pulse 69  Temp(Src) 98.4 F (36.9 C) (Oral)  Resp 16  Ht 5\' 5"  (1.651 m)  Wt 185 lb (83.915 kg)  BMI 30.79 kg/m2  SpO2 100%  LMP 06/01/2015 Physical Exam  Constitutional: She is oriented to person, place, and time. She appears well-developed and well-nourished.  HENT:  Head: Normocephalic and atraumatic.  Mouth/Throat: Uvula is midline, oropharynx is clear and moist and mucous membranes are normal. No trismus in the jaw. Abnormal dentition. Dental caries present. No dental abscesses or uvula swelling. No posterior oropharyngeal edema.    Eyes: Conjunctivae and EOM are normal. Right eye exhibits no discharge. Left eye exhibits no discharge. No scleral icterus.  Neck: Normal range of motion. Neck supple.  Cardiovascular: Normal rate.   Pulmonary/Chest: Effort normal.  Lymphadenopathy:    She has no cervical adenopathy.  Neurological: She is alert and oriented to person, place, and time.  Nursing note and vitals reviewed.   ED Course  Procedures (including critical care time) Labs Review Labs Reviewed - No data to display  Imaging Review No results found. I have personally reviewed and evaluated these images and lab results as part of my medical decision-making.  Filed Vitals:   06/15/15 1516  BP: 116/94  Pulse: 69  Temp:   Resp: 16   Meds given in ED:  Medications - No data to display  Discharge Medication List as of 06/15/2015  3:12 PM    START taking these medications   Details  ibuprofen (ADVIL,MOTRIN) 800 MG tablet Take 1 tablet (800 mg total) by mouth 3 (three) times daily., Starting 06/15/2015, Until Discontinued, Print    penicillin v potassium (VEETID) 500 MG tablet Take 1 tablet (500 mg total) by mouth 4 (four) times daily., Starting 06/15/2015, Until Mon 06/22/15, Print         MDM   Final diagnoses:  Pain, dental    Pt presents with left lower dental pain. Pt does not have  a dentist. Decaying left lower molar on exam with mild amount of erythema. Plan to d/c pt home with penicillin and ibuprofen. Pt given info regarding [facility] in [place]. Advised pt to see dentist this week.   Evaluation does not show pathology requring ongoing emergent intervention or admission. Pt is hemodynamically stable and mentating appropriately. Discussed findings/results and plan with patient/guardian, who agrees with plan. All questions answered. Return precautions discussed and outpatient follow up given.      Chesley Noon [place], [place] 06/15/15 1716  Veryl Speak, MD 06/16/15 1318

## 2015-06-15 NOTE — ED Notes (Signed)
Left lower tooth ache x "couple months"

## 2016-02-26 ENCOUNTER — Encounter (HOSPITAL_COMMUNITY): Payer: Self-pay | Admitting: *Deleted

## 2016-02-26 ENCOUNTER — Inpatient Hospital Stay (HOSPITAL_COMMUNITY): Payer: BLUE CROSS/BLUE SHIELD

## 2016-02-26 ENCOUNTER — Inpatient Hospital Stay (HOSPITAL_COMMUNITY)
Admission: AD | Admit: 2016-02-26 | Discharge: 2016-02-26 | Disposition: A | Discharge status: Home / Self Care | Payer: BLUE CROSS/BLUE SHIELD | Source: Ambulatory Visit | Attending: Family Medicine | Admitting: Family Medicine

## 2016-02-26 ENCOUNTER — Inpatient Hospital Stay (HOSPITAL_BASED_OUTPATIENT_CLINIC_OR_DEPARTMENT_OTHER): Payer: BLUE CROSS/BLUE SHIELD

## 2016-02-26 DIAGNOSIS — Z9889 Other specified postprocedural states: Secondary | ICD-10-CM | POA: Insufficient documentation

## 2016-02-26 DIAGNOSIS — O09291 Supervision of pregnancy with other poor reproductive or obstetric history, first trimester: Secondary | ICD-10-CM | POA: Insufficient documentation

## 2016-02-26 DIAGNOSIS — Z888 Allergy status to other drugs, medicaments and biological substances status: Secondary | ICD-10-CM | POA: Insufficient documentation

## 2016-02-26 DIAGNOSIS — O26899 Other specified pregnancy related conditions, unspecified trimester: Secondary | ICD-10-CM

## 2016-02-26 DIAGNOSIS — N8311 Corpus luteum cyst of right ovary: Secondary | ICD-10-CM

## 2016-02-26 DIAGNOSIS — N83202 Unspecified ovarian cyst, left side: Secondary | ICD-10-CM | POA: Insufficient documentation

## 2016-02-26 DIAGNOSIS — O3411 Maternal care for benign tumor of corpus uteri, first trimester: Secondary | ICD-10-CM | POA: Diagnosis not present

## 2016-02-26 DIAGNOSIS — D252 Subserosal leiomyoma of uterus: Secondary | ICD-10-CM | POA: Diagnosis not present

## 2016-02-26 DIAGNOSIS — O26891 Other specified pregnancy related conditions, first trimester: Secondary | ICD-10-CM | POA: Insufficient documentation

## 2016-02-26 DIAGNOSIS — R109 Unspecified abdominal pain: Secondary | ICD-10-CM | POA: Diagnosis not present

## 2016-02-26 DIAGNOSIS — R1031 Right lower quadrant pain: Secondary | ICD-10-CM | POA: Diagnosis not present

## 2016-02-26 DIAGNOSIS — N83201 Unspecified ovarian cyst, right side: Secondary | ICD-10-CM | POA: Diagnosis not present

## 2016-02-26 DIAGNOSIS — O9989 Other specified diseases and conditions complicating pregnancy, childbirth and the puerperium: Secondary | ICD-10-CM

## 2016-02-26 DIAGNOSIS — Z3A01 Less than 8 weeks gestation of pregnancy: Secondary | ICD-10-CM | POA: Diagnosis not present

## 2016-02-26 DIAGNOSIS — O219 Vomiting of pregnancy, unspecified: Secondary | ICD-10-CM | POA: Diagnosis not present

## 2016-02-26 DIAGNOSIS — O3481 Maternal care for other abnormalities of pelvic organs, first trimester: Secondary | ICD-10-CM | POA: Diagnosis not present

## 2016-02-26 LAB — URINALYSIS, ROUTINE W REFLEX MICROSCOPIC
Bilirubin Urine: NEGATIVE
GLUCOSE, UA: NEGATIVE mg/dL
HGB URINE DIPSTICK: NEGATIVE
Ketones, ur: NEGATIVE mg/dL
Leukocytes, UA: NEGATIVE
Nitrite: NEGATIVE
Protein, ur: NEGATIVE mg/dL
Specific Gravity, Urine: 1.02 (ref 1.005–1.030)
pH: 6 (ref 5.0–8.0)

## 2016-02-26 LAB — CBC
HCT: 34.8 % — ABNORMAL LOW (ref 36.0–46.0)
HEMOGLOBIN: 11.5 g/dL — AB (ref 12.0–15.0)
MCH: 30.2 pg (ref 26.0–34.0)
MCHC: 33 g/dL (ref 30.0–36.0)
MCV: 91.3 fL (ref 78.0–100.0)
Platelets: 262 10*3/uL (ref 150–400)
RBC: 3.81 MIL/uL — AB (ref 3.87–5.11)
RDW: 14.2 % (ref 11.5–15.5)
WBC: 7.9 10*3/uL (ref 4.0–10.5)

## 2016-02-26 LAB — POCT PREGNANCY, URINE: Preg Test, Ur: POSITIVE — AB

## 2016-02-26 LAB — WET PREP, GENITAL
SPERM: NONE SEEN
TRICH WET PREP: NONE SEEN
YEAST WET PREP: NONE SEEN

## 2016-02-26 LAB — HCG, QUANTITATIVE, PREGNANCY: hCG, Beta Chain, Quant, S: 25613 m[IU]/mL — ABNORMAL HIGH (ref <5)

## 2016-02-26 MED ORDER — PROMETHAZINE HCL 25 MG PO TABS: 25.0000 mg | ORAL_TABLET | Freq: Four times a day (QID) | ORAL | Status: DC | PRN

## 2016-02-26 MED ORDER — METRONIDAZOLE 500 MG PO TABS: 500.0000 mg | ORAL_TABLET | Freq: Two times a day (BID) | ORAL | Status: DC

## 2016-02-26 NOTE — Discharge Instructions (Signed)

## 2016-02-26 NOTE — MAU Provider Note (Signed)
History     CSN: [id number]  Arrival date and time: 02/26/16 1332   None     Chief Complaint  Patient presents with  . Abdominal Pain    right side   HPIpt is [redacted]w[redacted]d pregnant [id number] with abdominal cerclage.  Pt has poor ob hx with one loss at 17 weeks and another at 24 weeks.  Had abdominal cerclage in 2009 and had viable female in 2010 with CS- L transverse; pt had SAB in 2015. Today pt c/o or RLQ pain with +HPT.  Pt has hx of right ovarian cyst.  Pt denies spotting or bleeding, UTI sx, constipation or diarrhea.  RN note:  Expand All Collapse All   Patient presents with R side abdominal pain. Took +HPT yesterday. Hx of ovarian cysts. LMP 01/04/16. Denies vaginal bleeding or DC.       Past Medical History  Diagnosis Date  . Kidney stone   . History of lithotripsy   . H/O dilation and curettage     Past Surgical History  Procedure Laterality Date  . Lithotripsy    . Abdominal cerclage  2009  . Cesarean section    . Dilation and curettage of uterus      Family History  Problem Relation Age of Onset  . Asthma Mother     Social History  Substance Use Topics  . Smoking status: Never Smoker   . Smokeless tobacco: Never Used  . Alcohol Use: Yes    Allergies:  Allergies  Allergen Reactions  . Ciprofloxacin Nausea And Vomiting    No prescriptions prior to admission    Review of Systems  Constitutional: Negative for fever and chills.  Gastrointestinal: Positive for nausea and abdominal pain. Negative for vomiting, diarrhea and constipation.  Genitourinary: Negative for dysuria.  Neurological: Negative for dizziness and headaches.   Physical Exam   Blood pressure 98/50, pulse 79, temperature 99.1 F (37.3 C), temperature source Oral, resp. rate 16, height 5\' 2"  (1.575 m), weight 199 lb (90.266 kg), last menstrual period 01/04/2016, SpO2 100 %.  Physical Exam  Nursing note and vitals reviewed. Constitutional: She is oriented to person, place, and time.  She appears well-developed and well-nourished. No distress.  HENT:  Head: Normocephalic.  Eyes: Pupils are equal, round, and reactive to light.  Neck: Normal range of motion. Neck supple.  Cardiovascular: Normal rate.   Respiratory: Effort normal.  GI: Soft. She exhibits no distension. There is tenderness. There is no rebound.  Mildly Tender RLQ with palpation- no rebound  Genitourinary:  Clean, cervix closed, NT; uterus 6-8 week size  NT; adnexa without palpable enlargement or tenderness  Musculoskeletal: Normal range of motion.  Neurological: She is alert and oriented to person, place, and time.  Skin: Skin is warm and dry.  Psychiatric: She has a normal mood and affect.    MAU Course  Procedures Results for orders placed or performed during the hospital encounter of 02/26/16 (from the past 24 hour(s))  Urinalysis, Routine w reflex microscopic (not at [facility])     Status: None   Collection Time: 02/26/16  1:40 PM  Result Value Ref Range   Color, Urine YELLOW YELLOW   APPearance CLEAR CLEAR   Specific Gravity, Urine 1.020 1.005 - 1.030   pH 6.0 5.0 - 8.0   Glucose, UA NEGATIVE NEGATIVE mg/dL   Hgb urine dipstick NEGATIVE NEGATIVE   Bilirubin Urine NEGATIVE NEGATIVE   Ketones, ur NEGATIVE NEGATIVE mg/dL   Protein, ur NEGATIVE NEGATIVE mg/dL  Nitrite NEGATIVE NEGATIVE   Leukocytes, UA NEGATIVE NEGATIVE  Pregnancy, urine POC     Status: Abnormal   Collection Time: 02/26/16  1:44 PM  Result Value Ref Range   Preg Test, Ur POSITIVE (A) NEGATIVE  Wet prep, genital     Status: Abnormal   Collection Time: 02/26/16  2:00 PM  Result Value Ref Range   Yeast Wet Prep HPF POC NONE SEEN NONE SEEN   Trich, Wet Prep NONE SEEN NONE SEEN   Clue Cells Wet Prep HPF POC PRESENT (A) NONE SEEN   WBC, Wet Prep HPF POC FEW (A) NONE SEEN   Sperm NONE SEEN   CBC     Status: Abnormal   Collection Time: 02/26/16  2:05 PM  Result Value Ref Range   WBC 7.9 4.0 - 10.5 K/uL   RBC 3.81 (L) 3.87 - 5.11  MIL/uL   Hemoglobin 11.5 (L) 12.0 - 15.0 g/dL   HCT 34.8 (L) 36.0 - 46.0 %   MCV 91.3 78.0 - 100.0 fL   MCH 30.2 26.0 - 34.0 pg   MCHC 33.0 30.0 - 36.0 g/dL   RDW 14.2 11.5 - 15.5 %   Platelets 262 150 - 400 K/uL  hCG, quantitative, pregnancy     Status: Abnormal   Collection Time: 02/26/16  2:05 PM  Result Value Ref Range   hCG, Beta Chain, Quant, S 25613 (H) <5 mIU/mL  US Ob Comp Less 14 Wks  02/26/2016  CLINICAL DATA:  35 year old pregnant female presents with right lower quadrant abdominal pain and low back pain intermittently for several weeks. No bleeding reported. Quantitative beta HCG pending. EDC by LMP: 10/10/2016, projecting to an expected gestational age of [redacted] weeks 4 days. EXAM: OBSTETRIC <14 WK [place] AND TRANSVAGINAL OB US TECHNIQUE: Both transabdominal and transvaginal ultrasound examinations were performed for complete evaluation of the gestation as well as the maternal uterus, adnexal regions, and pelvic cul-de-sac. Transvaginal technique was performed to assess early pregnancy. COMPARISON:  No prior scans from this gestation. FINDINGS: Intrauterine gestational sac: Single intrauterine gestational sac appears normal in size, shape and position. Yolk sac:  Present. Embryo:  Present. Embryonic Cardiac Activity: Regular rate and rhythm. Embryonic Heart Rate: 150  bpm CRL:  13.5  mm   7 w   5 d                  [place] EDC: 10/09/2016 Subchorionic hemorrhage:  None visualized. Maternal uterus/adnexae: There are tiny adjacent subserosal fibroids in the left posterior uterine body measuring 1.4 x 0.9 x 1.4 cm and 1.3 x 0.8 x 1.0 cm. Right ovary measures 6.6 x 5.4 x 6.2 cm and contains a 4.9 x 4.4 x 5.4 cm simple cyst, probably a corpus luteal cyst. Left ovary measures 3.5 x 1.9 x 2.7 cm. No suspicious ovarian or adnexal masses. No abnormal free fluid the pelvis. IMPRESSION: 1. Single living intrauterine gestation at 7 weeks 5 days by crown-rump length, concordant with provided menstrual dating. 2. No  first-trimester gestational abnormality. 3. Small subserosal fibroids in the posterior uterus. 4. Simple 5.4 cm right ovarian cyst, probably corpus luteal cyst. No suspicious ovarian or adnexal masses. Electronically Signed   By: Ilona Sorrel M.D.   On: 02/26/2016 15:40   US Ob Transvaginal  02/26/2016  CLINICAL DATA:  35 year old pregnant female presents with right lower quadrant abdominal pain and low back pain intermittently for several weeks. No bleeding reported. Quantitative beta HCG pending. EDC by LMP: 10/10/2016, projecting to  an expected gestational age of [redacted] weeks 4 days. EXAM: OBSTETRIC <14 WK [place] AND TRANSVAGINAL OB US TECHNIQUE: Both transabdominal and transvaginal ultrasound examinations were performed for complete evaluation of the gestation as well as the maternal uterus, adnexal regions, and pelvic cul-de-sac. Transvaginal technique was performed to assess early pregnancy. COMPARISON:  No prior scans from this gestation. FINDINGS: Intrauterine gestational sac: Single intrauterine gestational sac appears normal in size, shape and position. Yolk sac:  Present. Embryo:  Present. Embryonic Cardiac Activity: Regular rate and rhythm. Embryonic Heart Rate: 150  bpm CRL:  13.5  mm   7 w   5 d                  [place] EDC: 10/09/2016 Subchorionic hemorrhage:  None visualized. Maternal uterus/adnexae: There are tiny adjacent subserosal fibroids in the left posterior uterine body measuring 1.4 x 0.9 x 1.4 cm and 1.3 x 0.8 x 1.0 cm. Right ovary measures 6.6 x 5.4 x 6.2 cm and contains a 4.9 x 4.4 x 5.4 cm simple cyst, probably a corpus luteal cyst. Left ovary measures 3.5 x 1.9 x 2.7 cm. No suspicious ovarian or adnexal masses. No abnormal free fluid the pelvis. IMPRESSION: 1. Single living intrauterine gestation at 7 weeks 5 days by crown-rump length, concordant with provided menstrual dating. 2. No first-trimester gestational abnormality. 3. Small subserosal fibroids in the posterior uterus. 4. Simple 5.4 cm  right ovarian cyst, probably corpus luteal cyst. No suspicious ovarian or adnexal masses. Electronically Signed   By: Ilona Sorrel M.D.   On: 02/26/2016 15:40    Assessment and Plan  Abdominal pain in preg- viable SLIUP [redacted]w[redacted]d Abdominal cerclage Right CLC Nausea in pregnancy F/u with OB care- pt plans care at [facility] 02/26/2016, 7:47 PM

## 2016-02-26 NOTE — MAU Note (Signed)
Patient presents with R side abdominal pain.  Took +HPT yesterday.  Hx of ovarian cysts.  LMP 01/04/16.  Denies vaginal bleeding or DC.

## 2016-02-29 LAB — GC/CHLAMYDIA PROBE AMP (~~LOC~~) NOT AT ARMC
Chlamydia: NEGATIVE
Neisseria Gonorrhea: NEGATIVE

## 2016-12-31 ENCOUNTER — Encounter (HOSPITAL_COMMUNITY): Payer: Self-pay

## 2017-12-20 ENCOUNTER — Other Ambulatory Visit: Payer: Self-pay

## 2017-12-20 ENCOUNTER — Inpatient Hospital Stay (HOSPITAL_COMMUNITY)
Admission: AD | Admit: 2017-12-20 | Discharge: 2017-12-20 | Disposition: A | Discharge status: Home / Self Care | Payer: Managed Care, Other (non HMO) | Source: Ambulatory Visit | Attending: Obstetrics & Gynecology | Admitting: Obstetrics & Gynecology

## 2017-12-20 ENCOUNTER — Encounter (HOSPITAL_COMMUNITY): Payer: Self-pay | Admitting: *Deleted

## 2017-12-20 DIAGNOSIS — R300 Dysuria: Secondary | ICD-10-CM | POA: Diagnosis present

## 2017-12-20 DIAGNOSIS — Z87442 Personal history of urinary calculi: Secondary | ICD-10-CM | POA: Insufficient documentation

## 2017-12-20 DIAGNOSIS — L298 Other pruritus: Secondary | ICD-10-CM | POA: Diagnosis present

## 2017-12-20 DIAGNOSIS — Z881 Allergy status to other antibiotic agents status: Secondary | ICD-10-CM | POA: Insufficient documentation

## 2017-12-20 DIAGNOSIS — Z113 Encounter for screening for infections with a predominantly sexual mode of transmission: Secondary | ICD-10-CM | POA: Insufficient documentation

## 2017-12-20 DIAGNOSIS — B9689 Other specified bacterial agents as the cause of diseases classified elsewhere: Secondary | ICD-10-CM | POA: Insufficient documentation

## 2017-12-20 DIAGNOSIS — N898 Other specified noninflammatory disorders of vagina: Secondary | ICD-10-CM | POA: Insufficient documentation

## 2017-12-20 DIAGNOSIS — N3001 Acute cystitis with hematuria: Secondary | ICD-10-CM | POA: Diagnosis not present

## 2017-12-20 DIAGNOSIS — N760 Acute vaginitis: Secondary | ICD-10-CM | POA: Insufficient documentation

## 2017-12-20 LAB — WET PREP, GENITAL
SPERM: NONE SEEN
TRICH WET PREP: NONE SEEN
WBC, Wet Prep HPF POC: NONE SEEN
Yeast Wet Prep HPF POC: NONE SEEN

## 2017-12-20 LAB — URINALYSIS, ROUTINE W REFLEX MICROSCOPIC
BILIRUBIN URINE: NEGATIVE
GLUCOSE, UA: NEGATIVE mg/dL
KETONES UR: NEGATIVE mg/dL
NITRITE: POSITIVE — AB
PH: 5 (ref 5.0–8.0)
Protein, ur: 30 mg/dL — AB
Specific Gravity, Urine: 1.019 (ref 1.005–1.030)

## 2017-12-20 LAB — POCT PREGNANCY, URINE: Preg Test, Ur: NEGATIVE

## 2017-12-20 MED ORDER — FLUCONAZOLE 150 MG PO TABS: 150.0000 mg | ORAL_TABLET | Freq: Every day | ORAL | 0 refills | Status: DC

## 2017-12-20 MED ORDER — SULFAMETHOXAZOLE-TRIMETHOPRIM 800-160 MG PO TABS: 1.0000 | ORAL_TABLET | Freq: Two times a day (BID) | ORAL | 0 refills | Status: DC

## 2017-12-20 MED ORDER — METRONIDAZOLE 500 MG PO TABS: 500.0000 mg | ORAL_TABLET | Freq: Two times a day (BID) | ORAL | 0 refills | Status: DC

## 2017-12-20 NOTE — MAU Note (Signed)
Patient presents with vaginal irritation, dysuria and lower abdominal pain.   Vaginal irritation began two weeks ago, patient states she was prescribed diflucan for a yeast infection by her PCP one week ago.  Irritation and milky, watery discharge still persist.   Dysuria began yesterday- pt states scant blood with urination.

## 2017-12-20 NOTE — Discharge Instructions (Signed)
Urinary Tract Infection, Adult A urinary tract infection (UTI) is an infection of any part of the urinary tract, which includes the kidneys, ureters, bladder, and urethra. These organs make, store, and get rid of urine in the body. UTI can be a bladder infection (cystitis) or kidney infection (pyelonephritis). What are the causes? This infection may be caused by fungi, viruses, or bacteria. Bacteria are the most common cause of UTIs. This condition can also be caused by repeated incomplete emptying of the bladder during urination. What increases the risk? This condition is more likely to develop if:  You ignore your need to urinate or hold urine for long periods of time.  You do not empty your bladder completely during urination.  You wipe back to front after urinating or having a bowel movement, if you are female.  You are uncircumcised, if you are female.  You are constipated.  You have a urinary catheter that stays in place (indwelling).  You have a weak defense (immune) system.  You have a medical condition that affects your bowels, kidneys, or bladder.  You have diabetes.  You take antibiotic medicines frequently or for long periods of time, and the antibiotics no longer work well against certain types of infections (antibiotic resistance).  You take medicines that irritate your urinary tract.  You are exposed to chemicals that irritate your urinary tract.  You are female.  What are the signs or symptoms? Symptoms of this condition include:  Fever.  Frequent urination or passing small amounts of urine frequently.  Needing to urinate urgently.  Pain or burning with urination.  Urine that smells bad or unusual.  Cloudy urine.  Pain in the lower abdomen or back.  Trouble urinating.  Blood in the urine.  Vomiting or being less hungry than normal.  Diarrhea or abdominal pain.  Vaginal discharge, if you are female.  How is this diagnosed? This condition is  diagnosed with a medical history and physical exam. You will also need to provide a urine sample to test your urine. Other tests may be done, including:  Blood tests.  Sexually transmitted disease (STD) testing.  If you have had more than one UTI, a cystoscopy or imaging studies may be done to determine the cause of the infections. How is this treated? Treatment for this condition often includes a combination of two or more of the following:  Antibiotic medicine.  Other medicines to treat less common causes of UTI.  Over-the-counter medicines to treat pain.  Drinking enough water to stay hydrated.  Follow these instructions at home:  Take over-the-counter and prescription medicines only as told by your health care provider.  If you were prescribed an antibiotic, take it as told by your health care provider. Do not stop taking the antibiotic even if you start to feel better.  Avoid alcohol, caffeine, tea, and carbonated beverages. They can irritate your bladder.  Drink enough fluid to keep your urine clear or pale yellow.  Keep all follow-up visits as told by your health care provider. This is important.  Make sure to: ? Empty your bladder often and completely. Do not hold urine for long periods of time. ? Empty your bladder before and after sex. ? Wipe from front to back after a bowel movement if you are female. Use each tissue one time when you wipe. Contact a health care provider if:  You have back pain.  You have a fever.  You feel nauseous or vomit.  Your symptoms do not  get better after 3 days. °· Your symptoms go away and then return. °Get help right away if: °· You have severe back pain or lower abdominal pain. °· You are vomiting and cannot keep down any medicines or water. °This information is not intended to replace advice given to you by your health care provider. Make sure you discuss any questions you have with your health care provider. °Document Released:  07/06/2005 Document Revised: 03/09/2016 Document Reviewed: 08/17/2015 °Elsevier Interactive Patient Education © 2018 [employer] ° ° ° ° °Bacterial Vaginosis °Bacterial vaginosis is an infection of the vagina. It happens when too many germs (bacteria) grow in the vagina. This infection puts you at risk for infections from sex (STIs). Treating this infection can lower your risk for some STIs. You should also treat this if you are pregnant. It can cause your baby to be born early. °Follow these instructions at home: °Medicines °· Take over-the-counter and prescription medicines only as told by your doctor. °· Take or use your antibiotic medicine as told by your doctor. Do not stop taking or using it even if you start to feel better. °General instructions °· If you your sexual partner is a woman, tell her that you have this infection. She needs to get treatment if she has symptoms. If you have a female partner, he does not need to be treated. °· During treatment: °? Avoid sex. °? Do not douche. °? Avoid alcohol as told. °? Avoid breastfeeding as told. °· Drink enough fluid to keep your pee (urine) clear or pale yellow. °· Keep your vagina and butt (rectum) clean. °? Wash the area with warm water every day. °? Wipe from front to back after you use the toilet. °· Keep all follow-up visits as told by your doctor. This is important. °Preventing this condition °· Do not douche. °· Use only warm water to wash around your vagina. °· Use protection when you have sex. This includes: °? Latex condoms. °? Dental dams. °· Limit how many people you have sex with. It is best to only have sex with the same person (be monogamous). °· Get tested for STIs. Have your partner get tested. °· Wear underwear that is cotton or lined with cotton. °· Avoid tight pants and pantyhose. This is most important in summer. °· Do not use any products that have nicotine or tobacco in them. These include cigarettes and e-cigarettes. If you need help  quitting, ask your doctor. °· Do not use illegal drugs. °· Limit how much alcohol you drink. °Contact a doctor if: °· Your symptoms do not get better, even after you are treated. °· You have more discharge or pain when you pee (urinate). °· You have a fever. °· You have pain in your belly (abdomen). °· You have pain with sex. °· Your bleed from your vagina between periods. °Summary °· This infection happens when too many germs (bacteria) grow in the vagina. °· Treating this condition can lower your risk for some infections from sex (STIs). °· You should also treat this if you are pregnant. It can cause early (premature) birth. °· Do not stop taking or using your antibiotic medicine even if you start to feel better. °This information is not intended to replace advice given to you by your health care provider. Make sure you discuss any questions you have with your health care provider. °Document Released: 07/05/2008 Document Revised: 06/11/2016 Document Reviewed: 06/11/2016 °Elsevier Interactive Patient Education © 2017 Else[employer] Inc. ° °

## 2017-12-20 NOTE — MAU Note (Signed)
Pt presents with c/o lower abdominal pain and dysuria that began last night.  Pt reports urinary frequency & urgency.  Pt reports also noted pinkish discharge with wiping.  Also c/o vaginal itching, states recently had yeast infection & took Diflucan.   LMP 11/24/2017

## 2017-12-20 NOTE — MAU Provider Note (Signed)
History     CSN: [id number]  Arrival date and time: 12/20/17 3716   First Provider Initiated Contact with Patient 12/20/17 [phone]      Chief Complaint  Patient presents with  . Dysuria  . Vaginal Itching   HPI Krystal [name] is a 37 y.o. non pregnant female who presents with dysuria and vaginal discharge. Reports burning with urination, increased urinary frequency, and possible hematuria since yesterday. Symptoms occur with every urination. Rates pain 7/10. Reports suprapubic pain & burning that occurs when she voids. Denies n/v, fever/chills, or flank pain.  Also reports vaginal itching. Was treated for a yeast infection last week by her PCP. Since then the vaginal discharge has improved but she still has noticed some irritation. She is in monogamous relationship for the last year & does not routinely use condoms. Would like to be swabbed for STI today. States she has recently changed her soap & has started using new dryer sheets. States she normally has sensitive skin.  Denies vaginal bleeding, dyspareunia, or post coital bleeding.    Past Medical History:  Diagnosis Date  . H/O dilation and curettage   . History of lithotripsy   . Kidney stone     Past Surgical History:  Procedure Laterality Date  . ABDOMINAL CERCLAGE  2009  . CESAREAN SECTION    . DILATION AND CURETTAGE OF UTERUS    . LITHOTRIPSY      Family History  Problem Relation Age of Onset  . Asthma Mother     Social History   Tobacco Use  . Smoking status: Never Smoker  . Smokeless tobacco: Never Used  Substance Use Topics  . Alcohol use: Yes  . Drug use: No    Allergies:  Allergies  Allergen Reactions  . Ciprofloxacin Nausea And Vomiting    Medications Prior to Admission  Medication Sig Dispense Refill Last Dose  . metroNIDAZOLE (FLAGYL) 500 MG tablet Take 1 tablet (500 mg total) by mouth 2 (two) times daily. 14 tablet 0 More than a month at Unknown time  . promethazine (PHENERGAN) 25 MG tablet  Take 1 tablet (25 mg total) by mouth every 6 (six) hours as needed for nausea or vomiting. 30 tablet 0 More than a month at Unknown time    Review of Systems  Constitutional: Negative for chills and fever.  Gastrointestinal: Positive for abdominal pain. Negative for diarrhea, nausea and vomiting.  Genitourinary: Positive for dysuria, frequency, hematuria and vaginal discharge. Negative for difficulty urinating, dyspareunia, flank pain and vaginal bleeding.  Musculoskeletal: Negative for back pain.   Physical Exam   Blood pressure 98/78, pulse (!) 103, temperature 99.1 F (37.3 C), temperature source Oral, resp. rate 20, height 5\' 5"  (1.651 m), weight 225 lb 8 oz (102.3 kg), last menstrual period 11/24/2017, SpO2 98 %, unknown if currently breastfeeding.  Physical Exam  Nursing note and vitals reviewed. Constitutional: She is oriented to person, place, and time. She appears well-developed and well-nourished. No distress.  HENT:  Head: Normocephalic and atraumatic.  Eyes: Conjunctivae are normal. Right eye exhibits no discharge. Left eye exhibits no discharge. No scleral icterus.  Neck: Normal range of motion.  Cardiovascular: Normal rate, regular rhythm and normal heart sounds.  No murmur heard. Respiratory: Effort normal and breath sounds normal. No respiratory distress. She has no wheezes.  GI: Soft. Bowel sounds are normal. There is no tenderness. There is no rebound and no CVA tenderness.  Genitourinary: There is no rash, tenderness or lesion on the right  labia. There is no rash, tenderness or lesion on the left labia. Cervix exhibits no motion tenderness and no friability. There is erythema in the vagina. No bleeding in the vagina. Vaginal discharge found.  Neurological: She is alert and oriented to person, place, and time.  Skin: Skin is warm and dry. She is not diaphoretic.  Psychiatric: She has a normal mood and affect. Her behavior is normal. Judgment and thought content normal.     MAU Course  Procedures Results for orders placed or performed during the hospital encounter of 12/20/17 (from the past 24 hour(s))  Urinalysis, Routine w reflex microscopic     Status: Abnormal   Collection Time: 12/20/17  8:50 AM  Result Value Ref Range   Color, Urine YELLOW YELLOW   APPearance HAZY (A) CLEAR   Specific Gravity, Urine 1.019 1.005 - 1.030   pH 5.0 5.0 - 8.0   Glucose, UA NEGATIVE NEGATIVE mg/dL   Hgb urine dipstick LARGE (A) NEGATIVE   Bilirubin Urine NEGATIVE NEGATIVE   Ketones, ur NEGATIVE NEGATIVE mg/dL   Protein, ur 30 (A) NEGATIVE mg/dL   Nitrite POSITIVE (A) NEGATIVE   Leukocytes, UA MODERATE (A) NEGATIVE   RBC / HPF 6-30 0 - 5 RBC/hpf   WBC, UA TOO NUMEROUS TO COUNT 0 - 5 WBC/hpf   Bacteria, UA RARE (A) NONE SEEN   Squamous Epithelial / LPF 0-5 (A) NONE SEEN   Mucus PRESENT   Pregnancy, urine POC     Status: None   Collection Time: 12/20/17  9:11 AM  Result Value Ref Range   Preg Test, Ur NEGATIVE NEGATIVE  Wet prep, genital     Status: Abnormal   Collection Time: 12/20/17 10:12 AM  Result Value Ref Range   Yeast Wet Prep HPF POC NONE SEEN NONE SEEN   Trich, Wet Prep NONE SEEN NONE SEEN   Clue Cells Wet Prep HPF POC PRESENT (A) NONE SEEN   WBC, Wet Prep HPF POC NONE SEEN NONE SEEN   Sperm NONE SEEN     MDM UPT negative U/a + nitrites & hemoglobin. No CVAT & pt afebrile. Will tx for UTI.  GC/CT & wet prep collected. + clues, will tx for BV. Patient requesting diflucan in case abx cause yeast infection  Assessment and Plan  A; 1. Acute cystitis with hematuria   2. BV (bacterial vaginosis)   3. Routine screening for STI (sexually transmitted infection)    P: Discharge home Rx bactrim, flagyl, & diflucan GC/CT pending F/u with PCP  Jorje Guild 12/20/2017, 9:36 AM

## 2017-12-21 LAB — GC/CHLAMYDIA PROBE AMP (~~LOC~~) NOT AT ARMC
Chlamydia: NEGATIVE
Neisseria Gonorrhea: NEGATIVE

## 2018-02-19 ENCOUNTER — Other Ambulatory Visit: Payer: Self-pay

## 2018-02-19 ENCOUNTER — Emergency Department (HOSPITAL_BASED_OUTPATIENT_CLINIC_OR_DEPARTMENT_OTHER)
Admission: EM | Admit: 2018-02-19 | Discharge: 2018-02-19 | Disposition: A | Discharge status: Home / Self Care | Payer: PRIVATE HEALTH INSURANCE | Attending: Emergency Medicine | Admitting: Emergency Medicine

## 2018-02-19 ENCOUNTER — Encounter (HOSPITAL_BASED_OUTPATIENT_CLINIC_OR_DEPARTMENT_OTHER): Payer: Self-pay

## 2018-02-19 DIAGNOSIS — M5489 Other dorsalgia: Secondary | ICD-10-CM | POA: Diagnosis present

## 2018-02-19 DIAGNOSIS — M546 Pain in thoracic spine: Secondary | ICD-10-CM

## 2018-02-19 LAB — URINALYSIS, ROUTINE W REFLEX MICROSCOPIC
Bilirubin Urine: NEGATIVE
Glucose, UA: NEGATIVE mg/dL
Ketones, ur: NEGATIVE mg/dL
Nitrite: NEGATIVE
Protein, ur: NEGATIVE mg/dL
Specific Gravity, Urine: 1.015 (ref 1.005–1.030)
pH: 6 (ref 5.0–8.0)

## 2018-02-19 LAB — URINALYSIS, MICROSCOPIC (REFLEX)

## 2018-02-19 LAB — PREGNANCY, URINE: Preg Test, Ur: NEGATIVE

## 2018-02-19 MED ORDER — MELOXICAM 15 MG PO TABS: 15.0000 mg | ORAL_TABLET | Freq: Every day | ORAL | 0 refills | Status: DC

## 2018-02-19 MED ORDER — PREDNISONE 20 MG PO TABS: 40.0000 mg | ORAL_TABLET | Freq: Every day | ORAL | 0 refills | Status: DC

## 2018-02-19 MED ORDER — CYCLOBENZAPRINE HCL 10 MG PO TABS: 10.0000 mg | ORAL_TABLET | Freq: Three times a day (TID) | ORAL | 0 refills | Status: DC | PRN

## 2018-02-19 MED FILL — CYCLOBENZAPRINE HCL 10 MG T: 10 | 4 days supply | Qty: 12 | Fill #0

## 2018-02-19 MED FILL — predniSONE 20 MG TABS: 20 | 5 days supply | Qty: 10 | Fill #0

## 2018-02-19 MED FILL — MELOXICAM 15 MG TABLET: 15 | 10 days supply | Qty: 10 | Fill #0

## 2018-02-19 NOTE — ED Triage Notes (Signed)
Pt c/o mid back pain started while twisting/trying to lie on love seat 2 days ago-NAD-steady slow gait

## 2018-02-24 NOTE — ED Provider Notes (Signed)
[facility] EMERG[facility] DEPARTMENT Provider Note   CSN: [id number] Arrival date & time: 02/19/18  1147     History   Chief Complaint Chief Complaint  Patient presents with  . Back Pain    HPI Krystal [name] is a 37 y.o. female.  HPI 37 year old female with mid back pain.  Onset 2 days ago.  Patient reports that she was turning over on the love seat she felt pain in her mid back.  This pain was sniffily worse the next morning.  It is tolerable at rest although still has a mild ache.  It is worse with movement.  No respiratory complaints.  No acute numbness, tingling or focal loss of strength.  Denies any radicular symptoms.  Past Medical History:  Diagnosis Date  . H/O dilation and curettage   . History of lithotripsy   . Kidney stone     There are no active problems to display for this patient.   Past Surgical History:  Procedure Laterality Date  . ABDOMINAL CERCLAGE  2009  . CESAREAN SECTION    . DILATION AND CURETTAGE OF UTERUS    . LITHOTRIPSY       OB History    Gravida  5   Para  2   Term  1   Preterm  1   AB  3   Living  1     SAB  2   TAB  1   Ectopic      Multiple  0   Live Births  2            Home Medications    Prior to Admission medications   Medication Sig Start Date End Date Taking? Authorizing Provider  cyclobenzaprine (FLEXERIL) 10 MG tablet Take 1 tablet (10 mg total) by mouth 3 (three) times daily as needed for muscle spasms. 02/19/18   Virgel Manifold, MD  fluconazole (DIFLUCAN) 150 MG tablet Take 1 tablet (150 mg total) by mouth daily. 12/20/17   Jorje Guild, NP  meloxicam (MOBIC) 15 MG tablet Take 1 tablet (15 mg total) by mouth daily. 02/19/18   Virgel Manifold, MD  metroNIDAZOLE (FLAGYL) 500 MG tablet Take 1 tablet (500 mg total) by mouth 2 (two) times daily. 12/20/17   Jorje Guild, NP  predniSONE (DELTASONE) 20 MG tablet Take 2 tablets (40 mg total) by mouth daily. 02/19/18   Virgel Manifold, MD  promethazine  (PHENERGAN) 25 MG tablet Take 1 tablet (25 mg total) by mouth every 6 (six) hours as needed for nausea or vomiting. 02/26/16   West Pugh, NP  sulfamethoxazole-trimethoprim (BACTRIM DS,SEPTRA DS) 800-160 MG tablet Take 1 tablet by mouth 2 (two) times daily. 12/20/17   Jorje Guild, NP    Family History Family History  Problem Relation Age of Onset  . Asthma Mother     Social History Social History   Tobacco Use  . Smoking status: Never Smoker  . Smokeless tobacco: Never Used  Substance Use Topics  . Alcohol use: Yes    Comment: occ  . Drug use: No     Allergies   Ciprofloxacin   Review of Systems Review of Systems  All systems reviewed and negative, other than as noted in HPI.  Physical Exam Updated Vital Signs BP 109/71 (BP Location: Right Arm)   Pulse 80   Temp 99.2 F (37.3 C) (Oral)   Resp 18   Ht 5\' 5"  (1.651 m)   Wt 96.2 kg (212 lb)  LMP 02/07/2018   SpO2 100%   BMI 35.28 kg/m   Physical Exam  Constitutional: She appears well-developed and well-nourished. No distress.  HENT:  Head: Normocephalic and atraumatic.  Eyes: Conjunctivae are normal. Right eye exhibits no discharge. Left eye exhibits no discharge.  Neck: Neck supple.  Cardiovascular: Normal rate, regular rhythm and normal heart sounds. Exam reveals no gallop and no friction rub.  No murmur heard. Pulmonary/Chest: Effort normal and breath sounds normal. No respiratory distress.  Abdominal: Soft. She exhibits no distension. There is no tenderness.  Musculoskeletal: She exhibits tenderness. She exhibits no edema.  Mild tenderness to palpation across the mid to lower thoracic back.  No concerning skin changes.  Tenderness is not focally tender in the midline.  Neurological: She is alert.  Skin: Skin is warm and dry.  Psychiatric: She has a normal mood and affect. Her behavior is normal. Thought content normal.  Nursing note and vitals reviewed.    ED Treatments / Results   Labs (all labs ordered are listed, but only abnormal results are displayed) Labs Reviewed  URINALYSIS, ROUTINE W REFLEX MICROSCOPIC - Abnormal; Notable for the following components:      Result Value   APPearance HAZY (*)    Hgb urine dipstick TRACE (*)    Leukocytes, UA TRACE (*)    All other components within normal limits  URINALYSIS, MICROSCOPIC (REFLEX) - Abnormal; Notable for the following components:   Bacteria, UA RARE (*)    All other components within normal limits  PREGNANCY, URINE    EKG None  Radiology No results found.  Procedures Procedures (including critical care time)  Medications Ordered in ED Medications - No data to display   Initial Impression / Assessment and Plan / ED Course  I have reviewed the triage vital signs and the nursing notes.  Pertinent labs & imaging results that were available during my care of the patient were reviewed by me and considered in my medical decision making (see chart for details).     37 year old female with mid back pain.  Pretty clearly musculoskeletal in etiology.  No respiratory complaints.  No specific urinary complaints.  UA is not overly impressive.  Pain is worse with palpation and movement.  Plan systematic treatment.  No role for imaging at this point.  Return precautions were discussed.  Final Clinical Impressions(s) / ED Diagnoses   Final diagnoses:  Acute midline thoracic back pain    ED Discharge Orders        Ordered    meloxicam (MOBIC) 15 MG tablet  Daily     02/19/18 1435    predniSONE (DELTASONE) 20 MG tablet  Daily     02/19/18 1435    cyclobenzaprine (FLEXERIL) 10 MG tablet  3 times daily PRN     02/19/18 1435       Virgel Manifold, MD 02/24/18 [phone]

## 2019-03-16 ENCOUNTER — Emergency Department (HOSPITAL_BASED_OUTPATIENT_CLINIC_OR_DEPARTMENT_OTHER): Payer: PRIVATE HEALTH INSURANCE

## 2019-03-16 ENCOUNTER — Emergency Department (HOSPITAL_BASED_OUTPATIENT_CLINIC_OR_DEPARTMENT_OTHER)
Admission: EM | Admit: 2019-03-16 | Discharge: 2019-03-16 | Disposition: A | Discharge status: Home / Self Care | Payer: PRIVATE HEALTH INSURANCE | Attending: Emergency Medicine | Admitting: Emergency Medicine

## 2019-03-16 ENCOUNTER — Encounter (HOSPITAL_BASED_OUTPATIENT_CLINIC_OR_DEPARTMENT_OTHER): Payer: Self-pay | Admitting: *Deleted

## 2019-03-16 ENCOUNTER — Other Ambulatory Visit: Payer: Self-pay

## 2019-03-16 DIAGNOSIS — Z79899 Other long term (current) drug therapy: Secondary | ICD-10-CM | POA: Diagnosis not present

## 2019-03-16 DIAGNOSIS — R0789 Other chest pain: Secondary | ICD-10-CM | POA: Diagnosis not present

## 2019-03-16 DIAGNOSIS — Z87891 Personal history of nicotine dependence: Secondary | ICD-10-CM | POA: Diagnosis not present

## 2019-03-16 DIAGNOSIS — F419 Anxiety disorder, unspecified: Secondary | ICD-10-CM | POA: Insufficient documentation

## 2019-03-16 LAB — D-DIMER, QUANTITATIVE: D-Dimer, Quant: 0.54 ug/mL-FEU — ABNORMAL HIGH (ref 0.00–0.50)

## 2019-03-16 LAB — CBC WITH DIFFERENTIAL/PLATELET
Abs Immature Granulocytes: 0.02 10*3/uL (ref 0.00–0.07)
Basophils Absolute: 0.1 10*3/uL (ref 0.0–0.1)
Basophils Relative: 1 %
Eosinophils Absolute: 0.3 10*3/uL (ref 0.0–0.5)
Eosinophils Relative: 3 %
HCT: 37.5 % (ref 36.0–46.0)
Hemoglobin: 11.8 g/dL — ABNORMAL LOW (ref 12.0–15.0)
Immature Granulocytes: 0 %
Lymphocytes Relative: 28 %
Lymphs Abs: 2.9 10*3/uL (ref 0.7–4.0)
MCH: 29.5 pg (ref 26.0–34.0)
MCHC: 31.5 g/dL (ref 30.0–36.0)
MCV: 93.8 fL (ref 80.0–100.0)
Monocytes Absolute: 0.8 10*3/uL (ref 0.1–1.0)
Monocytes Relative: 8 %
Neutro Abs: 6.1 10*3/uL (ref 1.7–7.7)
Neutrophils Relative %: 60 %
Platelets: 392 10*3/uL (ref 150–400)
RBC: 4 MIL/uL (ref 3.87–5.11)
RDW: 14.4 % (ref 11.5–15.5)
WBC: 10.1 10*3/uL (ref 4.0–10.5)
nRBC: 0 % (ref 0.0–0.2)

## 2019-03-16 LAB — COMPREHENSIVE METABOLIC PANEL
ALT: 8 U/L (ref 0–44)
AST: 15 U/L (ref 15–41)
Albumin: 3.6 g/dL (ref 3.5–5.0)
Alkaline Phosphatase: 65 U/L (ref 38–126)
Anion gap: 8 (ref 5–15)
BUN: 10 mg/dL (ref 6–20)
CO2: 23 mmol/L (ref 22–32)
Calcium: 9 mg/dL (ref 8.9–10.3)
Chloride: 107 mmol/L (ref 98–111)
Creatinine, Ser: 0.87 mg/dL (ref 0.44–1.00)
GFR calc Af Amer: >60 mL/min (ref >60)
GFR calc non Af Amer: >60 mL/min (ref >60)
Glucose, Bld: 95 mg/dL (ref 70–99)
Potassium: 3.5 mmol/L (ref 3.5–5.1)
Sodium: 138 mmol/L (ref 135–145)
Total Bilirubin: 0.8 mg/dL (ref 0.3–1.2)
Total Protein: 7.8 g/dL (ref 6.5–8.1)

## 2019-03-16 LAB — URINALYSIS, ROUTINE W REFLEX MICROSCOPIC
Bilirubin Urine: NEGATIVE
Glucose, UA: NEGATIVE mg/dL
Hgb urine dipstick: NEGATIVE
Ketones, ur: NEGATIVE mg/dL
Leukocytes,Ua: NEGATIVE
Nitrite: NEGATIVE
Protein, ur: NEGATIVE mg/dL
Specific Gravity, Urine: >1.03 — ABNORMAL HIGH (ref 1.005–1.030)
pH: 6 (ref 5.0–8.0)

## 2019-03-16 LAB — PREGNANCY, URINE: Preg Test, Ur: NEGATIVE

## 2019-03-16 LAB — TROPONIN I: Troponin I: <0.03 ng/mL (ref <0.03)

## 2019-03-16 MED ORDER — IOHEXOL 350 MG/ML SOLN
100.0000 mL | Freq: Once | INTRAVENOUS | Status: AC | PRN
  Administered 2019-03-16: 75 mL via INTRAVENOUS

## 2019-03-16 MED ORDER — LORAZEPAM 1 MG PO TABS: 1.0000 mg | ORAL_TABLET | Freq: Three times a day (TID) | ORAL | 0 refills | Status: DC | PRN

## 2019-03-16 MED ORDER — LORAZEPAM 1 MG PO TABS
0.5000 mg | ORAL_TABLET | Freq: Once | ORAL | Status: AC
  Administered 2019-03-16: 20:00:00 0.5 mg via ORAL
  Filled 2019-03-16: qty 1

## 2019-03-16 NOTE — ED Notes (Signed)
Pt anxious that she has not been given a diagnosis r/t her symptoms after physician visits and testing. Pt states she takes mediation for anxiety but does not feel like the medication is helping nor needed. Pt states these are real symptoms and she doesn't feel like it is related to anxiety. Pt states she can feel tightening in her neck and then feels pain in her head with hot flashes, Pt verbalizes regular menses at this time.

## 2019-03-16 NOTE — ED Triage Notes (Signed)
Pt reports several weeks of intermittent chest pain (points to right side of chest), nausea, and neck "tightness". States at times she feels like she is going to pass out. Seen has seen her PCP and had her thyroid evaluated and chest xray but has not been given a diagnosis

## 2019-03-16 NOTE — ED Notes (Signed)
Pt dressing, waiting for ride

## 2019-03-16 NOTE — ED Provider Notes (Signed)
[facility] EMERGENCY DEPARTMENT Provider Note   CSN: [id number] Arrival date & time: 03/16/19  1928    History   Chief Complaint Chief Complaint  Patient presents with  . Chest Pain    HPI Krystal [name] is a 38 y.o. female.     HPI Patient presents with multiple symptoms.  States she has had the symptoms for several months.  States she is having "spasms" to the right side of her neck that radiate down into her chest and up into her right jaw.  This is associated with some shortness of breath.  She denies any fever or chills.  States that she intermittently has "swelling" to the right side of her neck and this is associated with difficulty swallowing.  She has occasional tongue numbness.  She denies any lower extremity swelling or pain.  Patient states she does have a family history of blood clots and her mother is taking a blood thinner.  She was recently started on birth control to regulate her periods. Past Medical History:  Diagnosis Date  . H/O dilation and curettage   . History of lithotripsy   . Kidney stone     There are no active problems to display for this patient.   Past Surgical History:  Procedure Laterality Date  . ABDOMINAL CERCLAGE  2009  . CESAREAN SECTION    . DILATION AND CURETTAGE OF UTERUS    . LITHOTRIPSY       OB History    Gravida  5   Para  2   Term  1   Preterm  1   AB  3   Living  1     SAB  2   TAB  1   Ectopic      Multiple  0   Live Births  2            Home Medications    Prior to Admission medications   Medication Sig Start Date End Date Taking? Authorizing Provider  Norgestimate-Ethinyl Estradiol Triphasic 0.18/0.215/0.25 MG-35 MCG tablet Take by mouth. 11/09/18 11/09/19 Yes [provider]  zolpidem (AMBIEN) 10 MG tablet Take by mouth. 11/09/18 05/08/19 Yes [provider]  cyclobenzaprine (FLEXERIL) 10 MG tablet Take 1 tablet (10 mg total) by mouth 3 (three) times daily as needed  for muscle spasms. 02/19/18   Virgel Manifold, MD  fluconazole (DIFLUCAN) 150 MG tablet Take 1 tablet (150 mg total) by mouth daily. 12/20/17   Jorje Guild, NP  LORazepam (ATIVAN) 1 MG tablet Take 1 tablet (1 mg total) by mouth 3 (three) times daily as needed for anxiety. 03/16/19   Julianne Rice, MD  meloxicam (MOBIC) 15 MG tablet Take 1 tablet (15 mg total) by mouth daily. 02/19/18   Virgel Manifold, MD  metroNIDAZOLE (FLAGYL) 500 MG tablet Take 1 tablet (500 mg total) by mouth 2 (two) times daily. 12/20/17   Jorje Guild, NP  predniSONE (DELTASONE) 20 MG tablet Take 2 tablets (40 mg total) by mouth daily. 02/19/18   Virgel Manifold, MD  promethazine (PHENERGAN) 25 MG tablet Take 1 tablet (25 mg total) by mouth every 6 (six) hours as needed for nausea or vomiting. 02/26/16   West Pugh, NP  sulfamethoxazole-trimethoprim (BACTRIM DS,SEPTRA DS) 800-160 MG tablet Take 1 tablet by mouth 2 (two) times daily. 12/20/17   Jorje Guild, NP    Family History Family History  Problem Relation Age of Onset  . Asthma Mother     Social History  Social History   Tobacco Use  . Smoking status: Former Research scientist (life sciences)  . Smokeless tobacco: Never Used  Substance Use Topics  . Alcohol use: Yes    Comment: occ  . Drug use: No     Allergies   Ciprofloxacin   Review of Systems Review of Systems  Constitutional: Negative for chills and fever.  HENT: Negative for sinus pressure, sore throat and trouble swallowing.   Eyes: Negative for photophobia and visual disturbance.  Respiratory: Positive for chest tightness and shortness of breath. Negative for cough.   Cardiovascular: Positive for chest pain. Negative for palpitations and leg swelling.  Gastrointestinal: Negative for abdominal pain, constipation, diarrhea, nausea and vomiting.  Musculoskeletal: Positive for myalgias and neck pain. Negative for arthralgias and back pain.  Skin: Negative for rash and wound.  Neurological: Positive for numbness.  Negative for dizziness, syncope, weakness, light-headedness and headaches.  Psychiatric/Behavioral: The patient is nervous/anxious.   All other systems reviewed and are negative.    Physical Exam Updated Vital Signs BP 118/80   Pulse 94   Temp 98.8 F (37.1 C) (Oral)   Resp 18   Ht 5\' 5"  (1.651 m)   Wt 96.2 kg   LMP 03/15/2019   SpO2 100%   Breastfeeding No   BMI 35.28 kg/m   Physical Exam Vitals signs and nursing note reviewed.  Constitutional:      Appearance: She is well-developed.     Comments: Very anxious appearing.  Tearful.  HENT:     Head: Normocephalic and atraumatic.     Mouth/Throat:     Mouth: Mucous membranes are moist.     Comments: Several fractured teeth but no tooth tenderness or dental abscesses intraoral fluctuant masses. Eyes:     Extraocular Movements: Extraocular movements intact.     Pupils: Pupils are equal, round, and reactive to light.  Neck:     Musculoskeletal: Normal range of motion and neck supple. No neck rigidity or muscular tenderness.     Vascular: No carotid bruit.     Comments: No obvious neck swelling.  No definite tenderness to palpation.  No meningismus.  No cervical lymphadenopathy.  No posterior midline cervical tenderness to palpation. Cardiovascular:     Rate and Rhythm: Regular rhythm. Tachycardia present.     Heart sounds: No murmur. No friction rub. No gallop.   Pulmonary:     Effort: Pulmonary effort is normal. No respiratory distress.     Breath sounds: Normal breath sounds. No stridor. No wheezing, rhonchi or rales.  Chest:     Chest wall: No tenderness.  Abdominal:     General: Bowel sounds are normal. There is no distension.     Palpations: Abdomen is soft.     Tenderness: There is no abdominal tenderness. There is no guarding or rebound.  Musculoskeletal: Normal range of motion.        General: No swelling, tenderness, deformity or signs of injury.     Right lower leg: No edema.     Left lower leg: No edema.      Comments: No midline thoracic or lumbar tenderness.  No lower extremity swelling, asymmetry or tenderness.  Distal pulses intact.  Lymphadenopathy:     Cervical: No cervical adenopathy.  Skin:    General: Skin is warm and dry.     Findings: No erythema or rash.  Neurological:     General: No focal deficit present.     Mental Status: She is alert and oriented to person, place,  and time.     Comments: Moving all extremities without focal deficit.  Sensation fully intact.  Psychiatric:        Behavior: Behavior normal.      ED Treatments / Results  Labs (all labs ordered are listed, but only abnormal results are displayed) Labs Reviewed  CBC WITH DIFFERENTIAL/PLATELET - Abnormal; Notable for the following components:      Result Value   Hemoglobin 11.8 (*)    All other components within normal limits  D-DIMER, QUANTITATIVE (NOT AT [facility]) - Abnormal; Notable for the following components:   D-Dimer, Quant 0.54 (*)    All other components within normal limits  URINALYSIS, ROUTINE W REFLEX MICROSCOPIC - Abnormal; Notable for the following components:   Specific Gravity, Urine >1.030 (*)    All other components within normal limits  COMPREHENSIVE METABOLIC PANEL  TROPONIN I  PREGNANCY, URINE    EKG EKG Interpretation  Date/Time:  Saturday March 16 2019 19:35:45 EDT Ventricular Rate:  110 PR Interval:    QRS Duration: 88 QT Interval:  327 QTC Calculation: 443 R Axis:   33 Text Interpretation:  Sinus tachycardia Baseline wander in lead(s) V5 V6 Confirmed by Julianne Rice [phone]) on 03/16/2019 7:59:27 PM   Radiology Dg Chest 2 View  Result Date: 03/16/2019 CLINICAL DATA:  38 year old female with history of chest pain and upper back pain for the past 2 weeks. No fever or cough. EXAM: CHEST - 2 VIEW COMPARISON:  Chest x-ray 01/22/2019. FINDINGS: Lung volumes are normal. No consolidative airspace disease. No pleural effusions. No pneumothorax. No pulmonary nodule or mass noted.  Pulmonary vasculature and the cardiomediastinal silhouette are within normal limits. IMPRESSION: No radiographic evidence of acute cardiopulmonary disease. Electronically Signed   By: Vinnie Langton M.D.   On: 03/16/2019 20:11   Ct Angio Chest Pe W And/or Wo Contrast  Result Date: 03/16/2019 CLINICAL DATA:  Intermittent chest pain EXAM: CT ANGIOGRAPHY CHEST WITH CONTRAST TECHNIQUE: Multidetector CT imaging of the chest was performed using the standard protocol during bolus administration of intravenous contrast. Multiplanar CT image reconstructions and MIPs were obtained to evaluate the vascular anatomy. CONTRAST:  67mL OMNIPAQUE IOHEXOL 350 MG/ML SOLN COMPARISON:  None. FINDINGS: Cardiovascular: Satisfactory opacification of the pulmonary arteries to the segmental level. No evidence of pulmonary embolism. Normal heart size. No pericardial effusion. Mediastinum/Nodes: No enlarged mediastinal, hilar, or axillary lymph nodes. Thyroid gland, trachea, and esophagus demonstrate no significant findings. Lungs/Pleura: Lungs are clear. No pleural effusion or pneumothorax. Upper Abdomen: No acute abnormality. Musculoskeletal: No chest wall abnormality. No acute or significant osseous findings. Review of the MIP images confirms the above findings. IMPRESSION: Negative examination for pulmonary embolism. Electronically Signed   By: Eddie Candle M.D.   On: 03/16/2019 21:08    Procedures Procedures (including critical care time)  Medications Ordered in ED Medications  LORazepam (ATIVAN) tablet 0.5 mg (0.5 mg Oral Given 03/16/19 2011)  iohexol (OMNIPAQUE) 350 MG/ML injection 100 mL (75 mLs Intravenous Contrast Given 03/16/19 2039)     Initial Impression / Assessment and Plan / ED Course  I have reviewed the triage vital signs and the nursing notes.  Pertinent labs & imaging results that were available during my care of the patient were reviewed by me and considered in my medical decision making (see chart for  details).        Patient appears much more calm after Ativan.  Tachycardia has resolved.  Patient did have mild elevation in d-dimer.  CT angios chest  without acute findings.  Symptoms are atypical for coronary artery disease.  EKG and troponin are normal.  Definitely anxiety is playing a role in the patient's symptomatology.  I have advised her to follow-up closely with her primary physician.  May need further testing if her symptoms persist.  Return precautions given.  Final Clinical Impressions(s) / ED Diagnoses   Final diagnoses:  Atypical chest pain  Anxiety    ED Discharge Orders         Ordered    LORazepam (ATIVAN) 1 MG tablet  3 times daily PRN     03/16/19 2124           Julianne Rice, MD 03/16/19 2125

## 2019-03-16 NOTE — ED Notes (Signed)
Patient transported to CT 

## 2019-03-16 NOTE — ED Notes (Signed)
ED Provider at bedside. 

## 2019-12-24 IMAGING — CR CHEST - 2 VIEW
2 series · 2 of 2 positions shown · non-contrast
Comparison: Chest x-ray 01/22/2019.

CLINICAL DATA: 38-year-old female with history of chest pain and
upper back pain for the past 2 weeks. No fever or cough.

EXAM:
CHEST - 2 VIEW

[w chest pa]
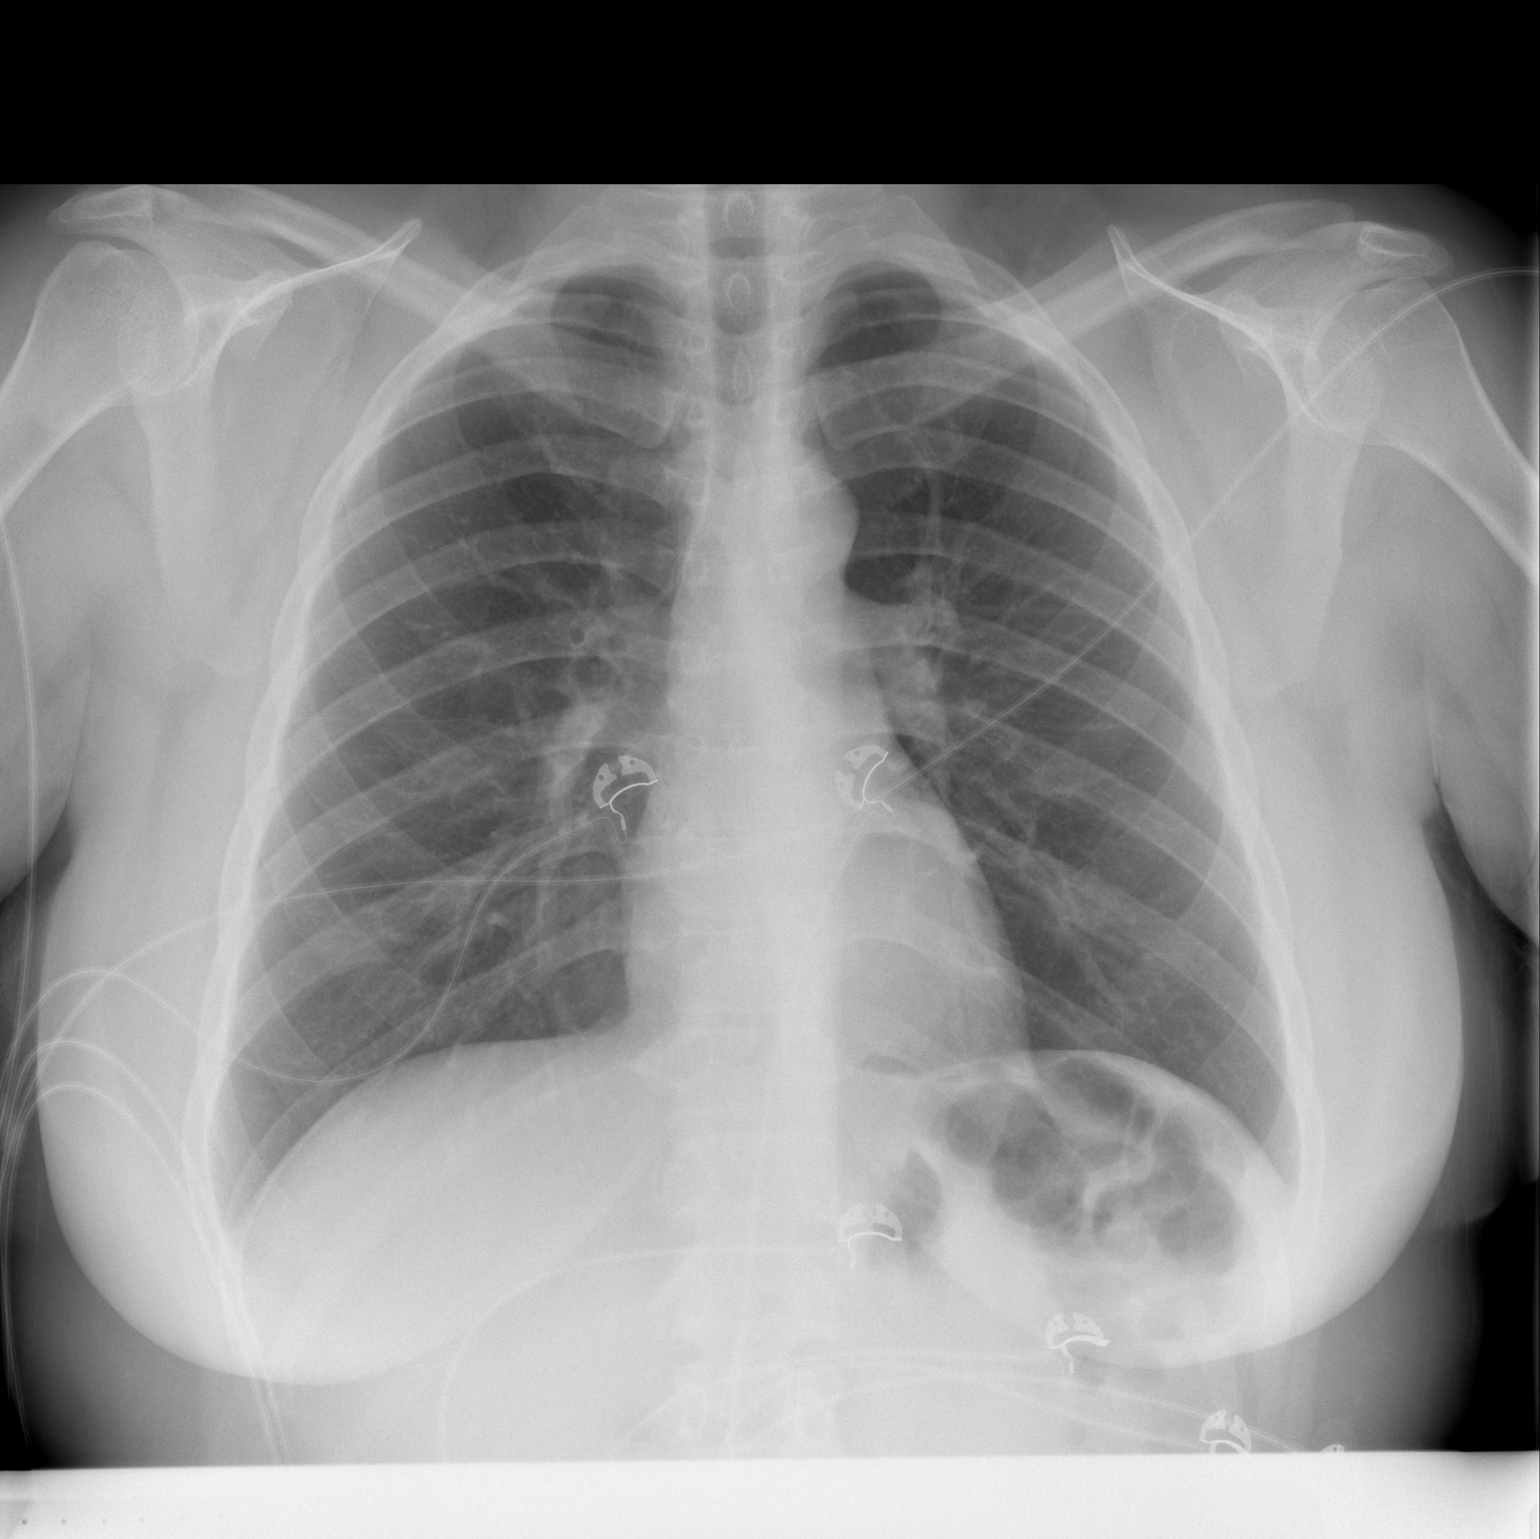

[w chest lat]
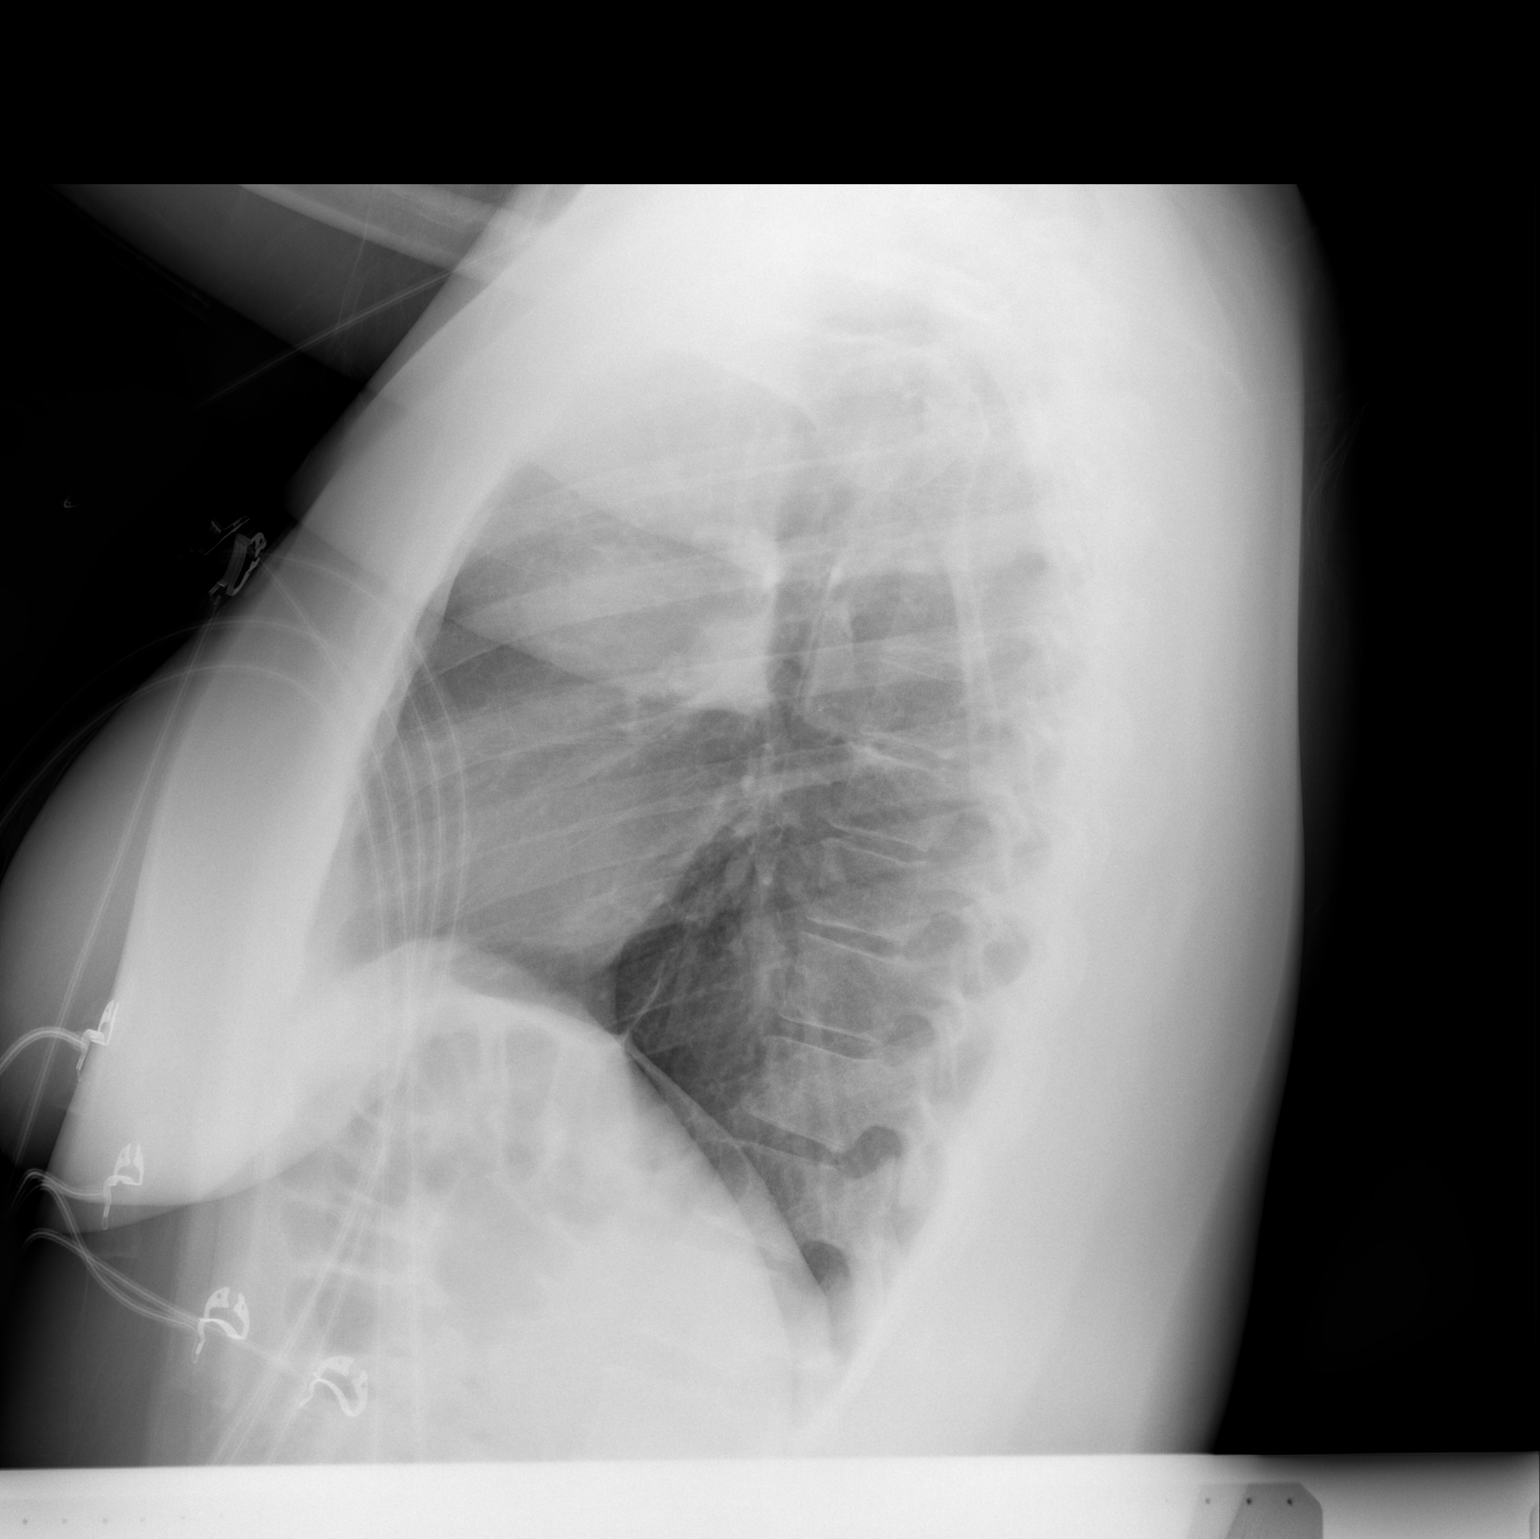

[2 of 2 positions shown; findings below may reference images not displayed]

FINDINGS: Lung volumes are normal. No consolidative airspace disease. No
pleural effusions. No pneumothorax. No pulmonary nodule or mass
noted. Pulmonary vasculature and the cardiomediastinal silhouette
are within normal limits.
IMPRESSION: No radiographic evidence of acute cardiopulmonary disease.

## 2019-12-24 IMAGING — CT CT ANGIOGRAPHY CHEST
1 of 10 series · 3 of 16 positions shown · IV contrast (omnipaque)
Comparison: None.

CLINICAL DATA: Intermittent chest pain

EXAM:
CT ANGIOGRAPHY CHEST WITH CONTRAST
TECHNIQUE: Multidetector CT imaging of the chest was performed using the
standard protocol during bolus administration of intravenous
contrast. Multiplanar CT image reconstructions and MIPs were
obtained to evaluate the vascular anatomy.
CONTRAST:  75mL OMNIPAQUE IOHEXOL 350 MG/ML SOLN

[Series 6: pe thins · axial · 0.82mm/px · z∈[+864,+1158]mm · 3 of 295 slices shown]
[im 1/295  lung]
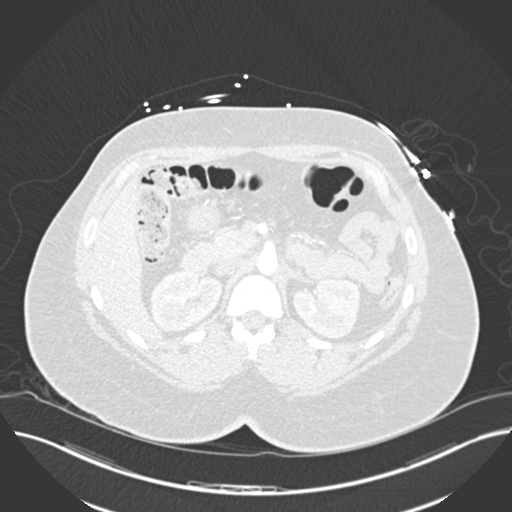
[im 148/295  soft-tissue]
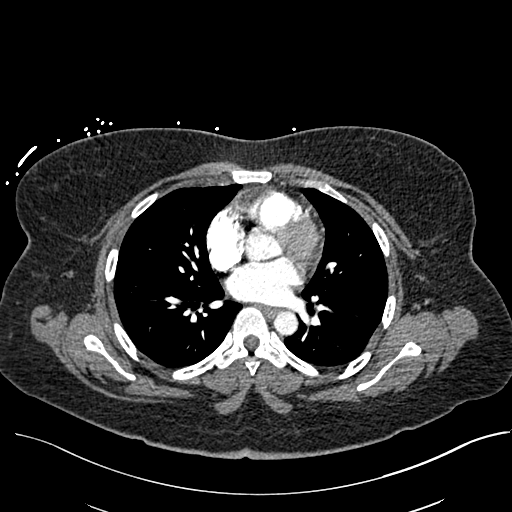
[im 295/295  lung]
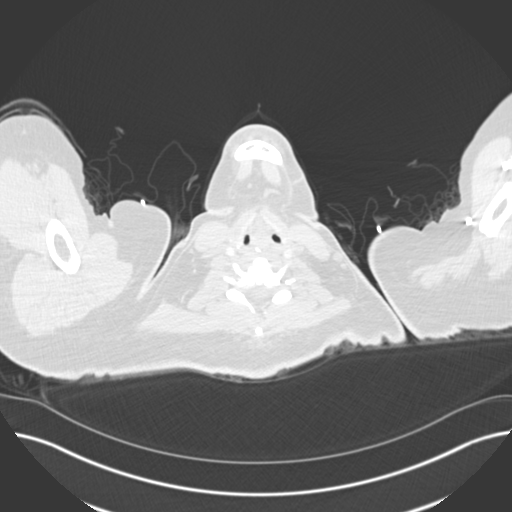

[3 of 16 positions shown; findings below may reference images not displayed]

FINDINGS: Cardiovascular: Satisfactory opacification of the pulmonary arteries
to the segmental level. No evidence of pulmonary embolism. Normal
heart size. No pericardial effusion.

Mediastinum/Nodes: No enlarged mediastinal, hilar, or axillary lymph
nodes. Thyroid gland, trachea, and esophagus demonstrate no
significant findings.

Lungs/Pleura: Lungs are clear. No pleural effusion or pneumothorax.

Upper Abdomen: No acute abnormality.

Musculoskeletal: No chest wall abnormality. No acute or significant
osseous findings.

Review of the MIP images confirms the above findings.
IMPRESSION: Negative examination for pulmonary embolism.

## 2020-09-11 ENCOUNTER — Emergency Department (HOSPITAL_BASED_OUTPATIENT_CLINIC_OR_DEPARTMENT_OTHER)
Admission: EM | Admit: 2020-09-11 | Discharge: 2020-09-11 | Disposition: A | Discharge status: Home / Self Care | Payer: PRIVATE HEALTH INSURANCE | Attending: Emergency Medicine | Admitting: Emergency Medicine

## 2020-09-11 ENCOUNTER — Encounter (HOSPITAL_BASED_OUTPATIENT_CLINIC_OR_DEPARTMENT_OTHER): Payer: Self-pay

## 2020-09-11 ENCOUNTER — Other Ambulatory Visit: Payer: Self-pay

## 2020-09-11 DIAGNOSIS — R519 Headache, unspecified: Secondary | ICD-10-CM | POA: Diagnosis present

## 2020-09-11 HISTORY — DX: Benign intracranial hypertension: G93.2

## 2020-09-11 HISTORY — DX: Cerebrospinal fluid leak, unspecified: G96.00

## 2020-09-11 MED ORDER — TRAMADOL HCL 50 MG PO TABS: 50.0000 mg | ORAL_TABLET | Freq: Four times a day (QID) | ORAL | 0 refills | Status: AC | PRN

## 2020-09-11 NOTE — ED Triage Notes (Signed)
Pt reports "headache all the time but worse since my last lumbar puncture and won't anybody give me anything for it"-NAD-steady gait

## 2020-09-11 NOTE — Discharge Instructions (Signed)
You were seen in the emergency department today with headache.  This could be related to your recent lumbar puncture and require a blood patch.  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  If you develop fever you need to be reevaluated in the emergency department immediately.  Please discuss your symptoms with your primary care doctor who can continue treating your headache pain as an outpatient.  Please keep your appointment with [facility].

## 2020-09-11 NOTE — ED Provider Notes (Signed)
Emergency Department Provider Note   I have reviewed the triage vital signs and the nursing notes.   HISTORY  Chief Complaint Headache   HPI Krystal [name] is a 39 y.o. female with known CSF leak following ENT surgery in 2020 and recent LP presents with continued HA. She has been followed by [facility] but has recently consulted with [facility] ENT. She denies fever or chills. HA is diffuse and throbbing. No visual change. No acute onset, maximal intensity symptoms. Patient was apparently offered a blood patch from [facility] but refused. Has tried many OTC medications and has had relief with Tramadol in the past.    Past Medical History:  Diagnosis Date  . CSF leak   . H/O dilation and curettage   . History of lithotripsy   . Increased intracranial pressure   . Kidney stone     There are no problems to display for this patient.   Past Surgical History:  Procedure Laterality Date  . ABDOMINAL CERCLAGE  2009  . CESAREAN SECTION    . DILATION AND CURETTAGE OF UTERUS    . LITHOTRIPSY      Allergies Ciprofloxacin  Family History  Problem Relation Age of Onset  . Asthma Mother     Social History Social History   Tobacco Use  . Smoking status: Never Smoker  . Smokeless tobacco: Never Used  Vaping Use  . Vaping Use: Never assessed  Substance Use Topics  . Alcohol use: Yes    Comment: occ  . Drug use: No    Review of Systems  Constitutional: No fever/chills Eyes: No visual changes. ENT: No sore throat. Cardiovascular: Denies chest pain. Respiratory: Denies shortness of breath. Gastrointestinal: No abdominal pain.  No nausea, no vomiting.  No diarrhea.  No constipation. Genitourinary: Negative for dysuria. Musculoskeletal: Negative for back pain. Skin: Negative for rash. Neurological: Negative for focal weakness or numbness. Positive HA.   10-point ROS otherwise negative.  ____________________________________________   PHYSICAL EXAM:  VITAL SIGNS: ED Triage  Vitals  Enc Vitals Group     BP 09/11/20 1832 (!) 135/92     Pulse Rate 09/11/20 1832 98     Resp 09/11/20 1832 18     Temp 09/11/20 1832 98.3 F (36.8 C)     Temp src --      SpO2 09/11/20 1832 100 %     Weight 09/11/20 1832 230 lb (104.3 kg)     Height 09/11/20 1832 5\' 5"  (1.651 m)   Constitutional: Alert and oriented. Well appearing and in no acute distress. Eyes: Conjunctivae are normal.  Head: Atraumatic. Nose: No congestion/rhinnorhea. No clear rhinorrhea.  Mouth/Throat: Mucous membranes are moist.  Neck: No stridor.  No meningeal signs.   Cardiovascular: Normal rate, regular rhythm. Good peripheral circulation. Grossly normal heart sounds.   Respiratory: Normal respiratory effort.  No retractions. Lungs CTAB. Gastrointestinal: Soft and nontender. No distention.  Musculoskeletal: No lower extremity tenderness nor edema. No gross deformities of extremities. Neurologic:  Normal speech and language. No gross focal neurologic deficits are appreciated.  Skin:  Skin is warm, dry and intact. No rash noted.  ____________________________________________   PROCEDURES  Procedure(s) performed:   Procedures  None ____________________________________________   INITIAL IMPRESSION / ASSESSMENT AND PLAN / ED COURSE  Pertinent labs & imaging results that were available during my care of the patient were reviewed by me and considered in my medical decision making (see chart for details).   Patient with known CSF leak now managed  at [facility]. Reviewed consult note from 11/30 at [facility]. Patient is on Diamox. No fever or infection symptoms. Suspect post-LP HA clinically and that blood patch may be helpful. Patient will defer this to [facility] team. Plans to call on Monday. No neuro deficits. I do not see an indication for repeat LP here or neuro imaging on an emergent basis. Will treat symptoms with brief course of Tramadol over the weekend and will call PCP and ENT team on Monday. Discussed strict ED  return precautions with fever, worsening pain, or weakness/numbness. [place] drug database reviewed prior to Tramadol Rx.    ____________________________________________  FINAL CLINICAL IMPRESSION(S) / ED DIAGNOSES  Final diagnoses:  Bad headache     NEW OUTPATIENT MEDICATIONS STARTED DURING THIS VISIT:  Discharge Medication List as of 09/11/2020 10:04 PM    START taking these medications   Details  traMADol (ULTRAM) 50 MG tablet Take 1 tablet (50 mg total) by mouth every 6 (six) hours as needed for up to 3 days for severe pain., Starting Fri 09/11/2020, Until Mon 09/14/2020 at 2359, Normal        Note:  This document was prepared using Dragon voice recognition software and may include unintentional dictation errors.  Nanda Quinton, MD, [facility] ([facility]) Emergency Medicine    Gertrude Bucks, [facility], MD 09/12/20 [phone]

## 2022-08-17 ENCOUNTER — Other Ambulatory Visit: Payer: Self-pay

## 2022-08-17 ENCOUNTER — Encounter (HOSPITAL_BASED_OUTPATIENT_CLINIC_OR_DEPARTMENT_OTHER): Payer: Self-pay | Admitting: Emergency Medicine

## 2022-08-17 ENCOUNTER — Emergency Department (HOSPITAL_BASED_OUTPATIENT_CLINIC_OR_DEPARTMENT_OTHER): Payer: PRIVATE HEALTH INSURANCE

## 2022-08-17 DIAGNOSIS — R002 Palpitations: Secondary | ICD-10-CM | POA: Insufficient documentation

## 2022-08-17 LAB — COMPREHENSIVE METABOLIC PANEL
ALT: 6 U/L (ref 0–44)
AST: 12 U/L — ABNORMAL LOW (ref 15–41)
Albumin: 3.7 g/dL (ref 3.5–5.0)
Alkaline Phosphatase: 87 U/L (ref 38–126)
Anion gap: 4 — ABNORMAL LOW (ref 5–15)
BUN: 10 mg/dL (ref 6–20)
CO2: 20 mmol/L — ABNORMAL LOW (ref 22–32)
Calcium: 8.4 mg/dL — ABNORMAL LOW (ref 8.9–10.3)
Chloride: 114 mmol/L — ABNORMAL HIGH (ref 98–111)
Creatinine, Ser: 0.79 mg/dL (ref 0.44–1.00)
GFR, Estimated: >60 mL/min (ref >60)
Glucose, Bld: 87 mg/dL (ref 70–99)
Potassium: 3.5 mmol/L (ref 3.5–5.1)
Sodium: 138 mmol/L (ref 135–145)
Total Bilirubin: 0.6 mg/dL (ref 0.3–1.2)
Total Protein: 7.8 g/dL (ref 6.5–8.1)

## 2022-08-17 LAB — CBC WITH DIFFERENTIAL/PLATELET
Abs Immature Granulocytes: 0.02 10*3/uL (ref 0.00–0.07)
Basophils Absolute: 0 10*3/uL (ref 0.0–0.1)
Basophils Relative: 0 %
Eosinophils Absolute: 0.1 10*3/uL (ref 0.0–0.5)
Eosinophils Relative: 1 %
HCT: 38.2 % (ref 36.0–46.0)
Hemoglobin: 12.2 g/dL (ref 12.0–15.0)
Immature Granulocytes: 0 %
Lymphocytes Relative: 32 %
Lymphs Abs: 1.9 10*3/uL (ref 0.7–4.0)
MCH: 30.7 pg (ref 26.0–34.0)
MCHC: 31.9 g/dL (ref 30.0–36.0)
MCV: 96 fL (ref 80.0–100.0)
Monocytes Absolute: 0.7 10*3/uL (ref 0.1–1.0)
Monocytes Relative: 11 %
Neutro Abs: 3.2 10*3/uL (ref 1.7–7.7)
Neutrophils Relative %: 56 %
Platelets: 297 10*3/uL (ref 150–400)
RBC: 3.98 MIL/uL (ref 3.87–5.11)
RDW: 14.1 % (ref 11.5–15.5)
WBC: 5.8 10*3/uL (ref 4.0–10.5)
nRBC: 0 % (ref 0.0–0.2)

## 2022-08-17 LAB — TROPONIN I (HIGH SENSITIVITY): Troponin I (High Sensitivity): <2 ng/L (ref <18)

## 2022-08-17 NOTE — ED Triage Notes (Signed)
Patient reports chest heaviness and palpitations on and off for a few days.

## 2022-08-18 ENCOUNTER — Emergency Department (HOSPITAL_BASED_OUTPATIENT_CLINIC_OR_DEPARTMENT_OTHER)
Admission: EM | Admit: 2022-08-18 | Discharge: 2022-08-18 | Disposition: A | Discharge status: Home / Self Care | Payer: PRIVATE HEALTH INSURANCE | Attending: Emergency Medicine | Admitting: Emergency Medicine

## 2022-08-18 DIAGNOSIS — R002 Palpitations: Secondary | ICD-10-CM

## 2022-08-18 NOTE — ED Provider Notes (Signed)
[place] [facility] Provider Note: Georgena Spurling, MD, FACEP  CSN: [id number] MRN: [id number] ARRIVAL: 08/17/22 at 2001 ROOM: [place]  Palpitations   HISTORY OF PRESENT ILLNESS  08/18/22 12:32 AM Krystal [name] is a 41 y.o. female who is on chronic acetazolamide for unknown CSF disorder.  She is here with several months of palpitations.  She describes the palpitations as brief episodes of either rapid or skipping beats.  She does not get chest pain or shortness of breath with these but does sometimes feel "catch" in her breathing for a second or two.  The symptoms are worse at night and are sometimes dependent on her sleeping position.    Past Medical History:  Diagnosis Date   CSF leak    H/O dilation and curettage    History of lithotripsy    Increased intracranial pressure    Kidney stone     Past Surgical History:  Procedure Laterality Date   ABDOMINAL CERCLAGE  2009   CESAREAN SECTION     DILATION AND CURETTAGE OF UTERUS     LITHOTRIPSY      Family History  Problem Relation Age of Onset   Asthma Mother     Social History   Tobacco Use   Smoking status: Never   Smokeless tobacco: Never  Substance Use Topics   Alcohol use: Yes    Comment: occ   Drug use: No    Prior to Admission medications   Medication Sig Start Date End Date Taking? Authorizing Provider  LORazepam (ATIVAN) 1 MG tablet Take 1 tablet (1 mg total) by mouth 3 (three) times daily as needed for anxiety. 03/16/19   Julianne Rice, MD  meloxicam (MOBIC) 15 MG tablet Take 1 tablet (15 mg total) by mouth daily. 02/19/18   Virgel Manifold, MD  Norgestimate-Ethinyl Estradiol Triphasic 0.18/0.215/0.25 MG-35 MCG tablet Take by mouth. 11/09/18 11/09/19  [provider]  zolpidem (AMBIEN) 10 MG tablet Take by mouth. 11/09/18 05/08/19  [provider]    Allergies Ciprofloxacin and Sulfamethoxazole-trimethoprim   REVIEW OF SYSTEMS  Negative except as noted here  or in the History of Present Illness.   PHYSICAL EXAMINATION  Initial Vital Signs Blood pressure (!) 120/90, pulse 81, temperature 98.3 F (36.8 C), temperature source Oral, resp. rate 20, height '5\' 5"'$  (1.651 m), weight 86.6 kg, SpO2 100 %.  Examination General: Well-developed, well-nourished female in no acute distress; appearance consistent with age of record HENT: normocephalic; atraumatic Eyes: Normal appearance Neck: supple Heart: regular rate and rhythm Lungs: clear to auscultation bilaterally Abdomen: soft; nondistended; nontender; bowel sounds present Extremities: No deformity; full range of motion; pulses normal Neurologic: Awake, alert and oriented; motor function intact in all extremities and symmetric; no facial droop Skin: Warm and dry Psychiatric: Normal mood and affect   RESULTS  Summary of this visit's results, reviewed and interpreted by myself:   EKG Interpretation  Date/Time:  Wednesday August 17 2022 20:13:29 EST Ventricular Rate:  85 PR Interval:  156 QRS Duration: 78 QT Interval:  356 QTC Calculation: 423 R Axis:   57 Text Interpretation: Normal sinus rhythm Normal ECG No significant change was found Confirmed by Jaymien Landin [phone]) on 08/17/2022 10:49:25 PM       Laboratory Studies: Results for orders placed or performed during the hospital encounter of 08/18/22 (from the past 24 hour(s))  CBC with Differential     Status: None   Collection Time: 08/17/22  8:20 PM  Result Value  Ref Range   WBC 5.8 4.0 - 10.5 K/uL   RBC 3.98 3.87 - 5.11 MIL/uL   Hemoglobin 12.2 12.0 - 15.0 g/dL   HCT 38.2 36.0 - 46.0 %   MCV 96.0 80.0 - 100.0 fL   MCH 30.7 26.0 - 34.0 pg   MCHC 31.9 30.0 - 36.0 g/dL   RDW 14.1 11.5 - 15.5 %   Platelets 297 150 - 400 K/uL   nRBC 0.0 0.0 - 0.2 %   Neutrophils Relative % 56 %   Neutro Abs 3.2 1.7 - 7.7 K/uL   Lymphocytes Relative 32 %   Lymphs Abs 1.9 0.7 - 4.0 K/uL   Monocytes Relative 11 %   Monocytes Absolute 0.7 0.1  - 1.0 K/uL   Eosinophils Relative 1 %   Eosinophils Absolute 0.1 0.0 - 0.5 K/uL   Basophils Relative 0 %   Basophils Absolute 0.0 0.0 - 0.1 K/uL   Immature Granulocytes 0 %   Abs Immature Granulocytes 0.02 0.00 - 0.07 K/uL  Comprehensive metabolic panel     Status: Abnormal   Collection Time: 08/17/22  8:20 PM  Result Value Ref Range   Sodium 138 135 - 145 mmol/L   Potassium 3.5 3.5 - 5.1 mmol/L   Chloride 114 (H) 98 - 111 mmol/L   CO2 20 (L) 22 - 32 mmol/L   Glucose, Bld 87 70 - 99 mg/dL   BUN 10 6 - 20 mg/dL   Creatinine, Ser 0.79 0.44 - 1.00 mg/dL   Calcium 8.4 (L) 8.9 - 10.3 mg/dL   Total Protein 7.8 6.5 - 8.1 g/dL   Albumin 3.7 3.5 - 5.0 g/dL   AST 12 (L) 15 - 41 U/L   ALT 6 0 - 44 U/L   Alkaline Phosphatase 87 38 - 126 U/L   Total Bilirubin 0.6 0.3 - 1.2 mg/dL   GFR, Estimated >60 >60 mL/min   Anion gap 4 (L) 5 - 15  Troponin I (High Sensitivity)     Status: None   Collection Time: 08/17/22  8:20 PM  Result Value Ref Range   Troponin I (High Sensitivity) <2 <18 ng/L   Imaging Studies: DG Chest 2 View  Result Date: 08/17/2022 CLINICAL DATA:  Chest heaviness EXAM: CHEST - 2 VIEW COMPARISON:  Radiographs 03/16/2019 FINDINGS: The heart size and mediastinal contours are within normal limits. Both lungs are clear. The visualized skeletal structures are unremarkable. IMPRESSION: No active cardiopulmonary disease. Electronically Signed   By: Placido Sou M.D.   On: 08/17/2022 20:36    ED COURSE and MDM  Nursing notes, initial and subsequent vitals signs, including pulse oximetry, reviewed and interpreted by myself.  Vitals:   08/17/22 2007 08/17/22 2008 08/18/22 0047  BP:  (!) 120/90 112/73  Pulse:  81 70  Resp:  20 17  Temp:  98.3 F (36.8 C) 99.3 F (37.4 C)  TempSrc:  Oral Oral  SpO2:  100% 100%  Weight: 86.6 kg    Height: '5\' 5"'$  (1.651 m)     Medications - No data to display  1:37 AM Patient's rhythm strip for her entire time on the monitor was reviewed.  It  showed sinus arrhythmia but no ectopy.  We will have her follow-up with cardiology for possible monitoring.  No evidence of cardiac ischemia on EKG or troponin.  Electrolyte abnormalities are likely due to chronic acetazolamide therapy.  PROCEDURES  Procedures   ED DIAGNOSES     ICD-10-CM   1. Palpitations  R00.2  Ambulatory referral to Cardiology         Shanon Rosser, MD 08/18/22 0140

## 2022-09-06 NOTE — Progress Notes (Signed)
Cardiology Office Note:    Date:  09/07/2022   ID:  Krystal [name], DOB 1981/04/05, MRN [id number]  PCP:  Curt Jews, PA-C  Cardiologist:  None   Referring MD: Curt Jews, PA-C   Chief Complaint  Patient presents with   Irregular Heart Beat    Palpitations    History of Present Illness:    Krystal [name] is a 41 y.o. female with a hx of   Recent emergency room evaluation 08/17/2022 with following complaint: "She is here with several months of palpitations. She describes the palpitations as brief episodes of either rapid or skipping beats."  Monitor strips for the period of time the patient was in the ER did not reveal any evidence of ectopy.  It was felt that she had a sinus arrhythmia per Dr. Florina Ou.  Description of her cardiac complaints.  She occasionally has a fluttering in her chest.  She can go days to months without noticing this.  She decided to have it evaluated because of all the other health problems that she has had.  She also has concern that since her mother who has asthma has been diagnosed with a heart problem that she needed to be aware of whether these palpitations were signaling trouble.  Has noticed some dyspnea on exertion.  Past Medical History:  Diagnosis Date   CSF leak    H/O dilation and curettage    History of lithotripsy    Increased intracranial pressure    Kidney stone    Palpitations     Past Surgical History:  Procedure Laterality Date   ABDOMINAL CERCLAGE  2009   CESAREAN SECTION     DILATION AND CURETTAGE OF UTERUS     LITHOTRIPSY      Current Medications: Current Meds  Medication Sig   acetaZOLAMIDE (DIAMOX) 250 MG tablet Take 250 mg by mouth as needed.   acetaZOLAMIDE ER (DIAMOX) 500 MG capsule Take 500 mg by mouth 2 (two) times daily.   aspirin EC 81 MG tablet Take 81 mg by mouth daily.   azelastine (ASTELIN) 0.1 % nasal spray Place 2 sprays into both nostrils 2 (two) times daily.   cyclobenzaprine (FLEXERIL) 5 MG tablet  Take 5 mg by mouth 3 (three) times daily as needed for muscle spasms.   fluticasone (FLONASE) 50 MCG/ACT nasal spray Place 2 sprays into both nostrils 2 (two) times daily.   LORazepam (ATIVAN) 0.5 MG tablet Take 0.5 mg by mouth 2 (two) times daily.   traMADol (ULTRAM) 50 MG tablet 50 mg 2 (two) times daily.   Vitamin D, Ergocalciferol, (DRISDOL) 1.25 MG (50000 UNIT) CAPS capsule Take 50,000 Units by mouth once a week.   zolpidem (AMBIEN CR) 12.5 MG CR tablet Take 12.5 mg by mouth at bedtime as needed for sleep.     Allergies:   Ciprofloxacin and Sulfamethoxazole-trimethoprim   Social History   Socioeconomic History   Marital status: Single    Spouse name: Not on file   Number of children: Not on file   Years of education: Not on file   Highest education level: Not on file  Occupational History   Not on file  Tobacco Use   Smoking status: Never   Smokeless tobacco: Never  Vaping Use   Vaping Use: Not on file  Substance and Sexual Activity   Alcohol use: Yes    Comment: occ   Drug use: No   Sexual activity: Not on file  Other Topics Concern  Not on file  Social History Narrative   Not on file   Social Determinants of Health   Financial Resource Strain: Not on file  Food Insecurity: Not on file  Transportation Needs: Not on file  Physical Activity: Not on file  Stress: Not on file  Social Connections: Not on file     Family History: The patient's family history includes Asthma in her mother.  ROS:   Please see the history of present illness.    2 to 2 months review brought.  CNS peritoneal drain.  All other systems reviewed and are negative.  EKGs/Labs/Other Studies Reviewed:    The following studies were reviewed today: None  EKG:  EKG normal sinus rhythm with normal-appearing EKG completed on 08/17/2022.  Recent Labs: 08/17/2022: ALT 6; BUN 10; Creatinine, Ser 0.79; Hemoglobin 12.2; Platelets 297; Potassium 3.5; Sodium 138  Recent Lipid Panel No results  found for: "CHOL", "TRIG", "HDL", "CHOLHDL", "VLDL", "LDLCALC", "LDLDIRECT"  Physical Exam:    VS:  BP 104/68   Pulse 87   Ht '5\' 5"'$  (1.651 m)   Wt 197 lb 3.2 oz (89.4 kg)   SpO2 98%   BMI 32.82 kg/m     Wt Readings from Last 3 Encounters:  09/07/22 197 lb 3.2 oz (89.4 kg)  08/17/22 191 lb (86.6 kg)  09/11/20 230 lb (104.3 kg)     GEN: Overweight. No acute distress HEENT: Normal NECK: No JVD. LYMPHATICS: No lymphadenopathy CARDIAC: No murmur. RRR no gallop, or edema. VASCULAR:  Normal Pulses. No bruits. RESPIRATORY:  Clear to auscultation without rales, wheezing or rhonchi  ABDOMEN: Soft, non-tender, non-distended, No pulsatile mass, MUSCULOSKELETAL: No deformity  SKIN: Warm and dry NEUROLOGIC:  Alert and oriented x 3 PSYCHIATRIC:  Normal affect   ASSESSMENT:    1. Palpitations   2. SOB (shortness of breath)    PLAN:    In order of problems listed above:  Sounds as though she has having premature beats.  The duration of fluttering is momentary.  Episodes unpredictable and are not associated with other symptoms.  She is currently in a quiet.  As she cannot remember the last time she had palpitations.  However, she was seen in the emergency room on November 8 because of palpitations and nothing was identified. Dyspnea on exertion is noted.  Given history of palpitations, a 2D Doppler echocardiogram is being ordered to rule out silent structural heart disease.  If echo is normal, watchful waiting.  If episodes of palpitation become more frequent, she will need to have a 2-week monitor.  Believe this would reveal anything at this time and therefore does not ordered.   Medication Adjustments/Labs and Tests Ordered: Current medicines are reviewed at length with the patient today.  Concerns regarding medicines are outlined above.  Orders Placed This Encounter  Procedures   ECHOCARDIOGRAM COMPLETE   No orders of the defined types were placed in this encounter.   Patient  Instructions  Medication Instructions:  Your physician recommends that you continue on your current medications as directed. Please refer to the Current Medication list given to you today.  *If you need a refill on your cardiac medications before your next appointment, please call your pharmacy*  Testing/Procedures: Your physician has requested that you have an echocardiogram. Echocardiography is a painless test that uses sound waves to create images of your heart. It provides your doctor with information about the size and shape of your heart and how well your heart's chambers and valves are  working. This procedure takes approximately one hour. There are no restrictions for this procedure. Please do NOT wear cologne, perfume, aftershave, or lotions (deodorant is allowed). Please arrive 15 minutes prior to your appointment time.  Follow-Up: As needed, will be determined based on results of echocardiogram.  Important Information About Sugar         Signed, Sinclair Grooms, MD  09/07/2022 10:27 AM    [place]

## 2022-09-07 ENCOUNTER — Encounter: Payer: Self-pay | Admitting: Interventional Cardiology

## 2022-09-07 ENCOUNTER — Ambulatory Visit: Payer: PRIVATE HEALTH INSURANCE | Attending: Interventional Cardiology | Admitting: Interventional Cardiology

## 2022-09-07 VITALS — BP 104/68 | HR 87 | Ht 65.0 in | Wt 197.2 lb

## 2022-09-07 DIAGNOSIS — R002 Palpitations: Secondary | ICD-10-CM | POA: Diagnosis not present

## 2022-09-07 DIAGNOSIS — R0602 Shortness of breath: Secondary | ICD-10-CM

## 2022-09-07 NOTE — Patient Instructions (Signed)
Medication Instructions:  Your physician recommends that you continue on your current medications as directed. Please refer to the Current Medication list given to you today.  *If you need a refill on your cardiac medications before your next appointment, please call your pharmacy*  Testing/Procedures: Your physician has requested that you have an echocardiogram. Echocardiography is a painless test that uses sound waves to create images of your heart. It provides your doctor with information about the size and shape of your heart and how well your heart's chambers and valves are working. This procedure takes approximately one hour. There are no restrictions for this procedure. Please do NOT wear cologne, perfume, aftershave, or lotions (deodorant is allowed). Please arrive 15 minutes prior to your appointment time.  Follow-Up: As needed, will be determined based on results of echocardiogram.  Important Information About Sugar

## 2022-12-30 ENCOUNTER — Ambulatory Visit (HOSPITAL_COMMUNITY): Payer: PRIVATE HEALTH INSURANCE

## 2023-02-22 ENCOUNTER — Telehealth (HOSPITAL_COMMUNITY): Payer: Self-pay | Admitting: Interventional Cardiology

## 2023-02-22 NOTE — Telephone Encounter (Signed)
Patient called and cancelled echocardiogram x 2. She will call back to reschedule appt. Order will be removed from the echo wq. Thank you

## 2023-02-24 ENCOUNTER — Ambulatory Visit (HOSPITAL_COMMUNITY): Payer: PRIVATE HEALTH INSURANCE
